# Patient Record
Sex: Female | Born: 1980 | Marital: Married | Smoking: Never smoker
Health system: Southern US, Community
[De-identification: ages and names within clinical notes are randomized; demographics above are authoritative.]

## PROBLEM LIST (undated history)

## (undated) ENCOUNTER — Inpatient Hospital Stay (HOSPITAL_COMMUNITY): Payer: Self-pay

## (undated) DIAGNOSIS — Z8619 Personal history of other infectious and parasitic diseases: Secondary | ICD-10-CM

## (undated) DIAGNOSIS — F419 Anxiety disorder, unspecified: Secondary | ICD-10-CM

## (undated) DIAGNOSIS — O24419 Gestational diabetes mellitus in pregnancy, unspecified control: Secondary | ICD-10-CM

## (undated) DIAGNOSIS — I10 Essential (primary) hypertension: Secondary | ICD-10-CM

## (undated) DIAGNOSIS — G43909 Migraine, unspecified, not intractable, without status migrainosus: Secondary | ICD-10-CM

## (undated) DIAGNOSIS — K219 Gastro-esophageal reflux disease without esophagitis: Secondary | ICD-10-CM

## (undated) DIAGNOSIS — L309 Dermatitis, unspecified: Secondary | ICD-10-CM

## (undated) DIAGNOSIS — J302 Other seasonal allergic rhinitis: Secondary | ICD-10-CM

## (undated) DIAGNOSIS — G988 Other disorders of nervous system: Secondary | ICD-10-CM

## (undated) HISTORY — PX: KNEE SURGERY: SHX244

## (undated) HISTORY — DX: Gastro-esophageal reflux disease without esophagitis: K21.9

## (undated) HISTORY — PX: WISDOM TOOTH EXTRACTION: SHX21

## (undated) HISTORY — DX: Essential (primary) hypertension: I10

## (undated) HISTORY — DX: Gestational diabetes mellitus in pregnancy, unspecified control: O24.419

## (undated) HISTORY — DX: Personal history of other infectious and parasitic diseases: Z86.19

## (undated) HISTORY — PX: BREAST REDUCTION SURGERY: SHX8

## (undated) HISTORY — DX: Other seasonal allergic rhinitis: J30.2

## (undated) HISTORY — DX: Migraine, unspecified, not intractable, without status migrainosus: G43.909

## (undated) HISTORY — DX: Anxiety disorder, unspecified: F41.9

## (undated) HISTORY — DX: Other disorders of nervous system: G98.8

## (undated) HISTORY — PX: BREAST SURGERY: SHX581

---
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
## 2004-06-10 ENCOUNTER — Encounter: Admission: RE | Admit: 2004-06-10 | Discharge: 2004-06-10 | Payer: Self-pay | Admitting: Orthopedic Surgery

## 2006-01-04 ENCOUNTER — Other Ambulatory Visit: Admission: RE | Admit: 2006-01-04 | Discharge: 2006-01-04 | Payer: Self-pay | Admitting: Obstetrics and Gynecology

## 2010-01-20 ENCOUNTER — Other Ambulatory Visit: Admission: RE | Admit: 2010-01-20 | Discharge: 2010-01-20 | Payer: Self-pay | Admitting: Family Medicine

## 2010-09-30 ENCOUNTER — Encounter: Admission: RE | Admit: 2010-09-30 | Discharge: 2010-09-30 | Payer: Self-pay | Admitting: Neurology

## 2011-03-10 ENCOUNTER — Other Ambulatory Visit (HOSPITAL_COMMUNITY)
Admission: RE | Admit: 2011-03-10 | Discharge: 2011-03-10 | Disposition: A | Discharge status: Home / Self Care | Payer: BC Managed Care – PPO | Source: Ambulatory Visit | Attending: Family Medicine | Admitting: Family Medicine

## 2011-03-10 ENCOUNTER — Other Ambulatory Visit: Payer: Self-pay | Admitting: Family Medicine

## 2011-03-10 DIAGNOSIS — Z113 Encounter for screening for infections with a predominantly sexual mode of transmission: Secondary | ICD-10-CM | POA: Insufficient documentation

## 2011-03-10 DIAGNOSIS — Z124 Encounter for screening for malignant neoplasm of cervix: Secondary | ICD-10-CM | POA: Insufficient documentation

## 2012-03-18 ENCOUNTER — Other Ambulatory Visit (HOSPITAL_COMMUNITY)
Admission: RE | Admit: 2012-03-18 | Discharge: 2012-03-18 | Disposition: A | Discharge status: Home / Self Care | Payer: BC Managed Care – PPO | Source: Ambulatory Visit | Attending: Family Medicine | Admitting: Family Medicine

## 2012-03-18 ENCOUNTER — Other Ambulatory Visit: Payer: Self-pay | Admitting: Family Medicine

## 2012-03-18 DIAGNOSIS — Z124 Encounter for screening for malignant neoplasm of cervix: Secondary | ICD-10-CM | POA: Insufficient documentation

## 2013-04-10 ENCOUNTER — Other Ambulatory Visit (HOSPITAL_COMMUNITY)
Admission: RE | Admit: 2013-04-10 | Discharge: 2013-04-10 | Disposition: A | Discharge status: Home / Self Care | Payer: BC Managed Care – PPO | Source: Ambulatory Visit | Attending: Family Medicine | Admitting: Family Medicine

## 2013-04-10 ENCOUNTER — Other Ambulatory Visit: Payer: Self-pay | Admitting: Family Medicine

## 2013-04-10 DIAGNOSIS — Z01419 Encounter for gynecological examination (general) (routine) without abnormal findings: Secondary | ICD-10-CM | POA: Insufficient documentation

## 2013-10-20 ENCOUNTER — Emergency Department (HOSPITAL_COMMUNITY)
Admission: EM | Admit: 2013-10-20 | Discharge: 2013-10-21 | Disposition: A | Discharge status: Home / Self Care | Payer: BC Managed Care – PPO | Attending: Emergency Medicine | Admitting: Emergency Medicine

## 2013-10-20 ENCOUNTER — Encounter (HOSPITAL_COMMUNITY): Payer: Self-pay | Admitting: Emergency Medicine

## 2013-10-20 ENCOUNTER — Emergency Department (HOSPITAL_COMMUNITY): Payer: BC Managed Care – PPO

## 2013-10-20 ENCOUNTER — Other Ambulatory Visit: Payer: Self-pay

## 2013-10-20 DIAGNOSIS — Z79899 Other long term (current) drug therapy: Secondary | ICD-10-CM | POA: Insufficient documentation

## 2013-10-20 DIAGNOSIS — Z3202 Encounter for pregnancy test, result negative: Secondary | ICD-10-CM | POA: Insufficient documentation

## 2013-10-20 DIAGNOSIS — G43709 Chronic migraine without aura, not intractable, without status migrainosus: Secondary | ICD-10-CM | POA: Insufficient documentation

## 2013-10-20 DIAGNOSIS — F411 Generalized anxiety disorder: Secondary | ICD-10-CM | POA: Insufficient documentation

## 2013-10-20 DIAGNOSIS — Z872 Personal history of diseases of the skin and subcutaneous tissue: Secondary | ICD-10-CM | POA: Insufficient documentation

## 2013-10-20 HISTORY — DX: Dermatitis, unspecified: L30.9

## 2013-10-20 HISTORY — DX: Migraine, unspecified, not intractable, without status migrainosus: G43.909

## 2013-10-20 HISTORY — DX: Anxiety disorder, unspecified: F41.9

## 2013-10-20 LAB — CBC WITH DIFFERENTIAL/PLATELET
Basophils Absolute: 0 10*3/uL (ref 0.0–0.1)
HCT: 41.3 % (ref 36.0–46.0)
Lymphocytes Relative: 25 % (ref 12–46)
Lymphs Abs: 1.8 10*3/uL (ref 0.7–4.0)
Monocytes Absolute: 0.4 10*3/uL (ref 0.1–1.0)
Neutro Abs: 4.8 10*3/uL (ref 1.7–7.7)
Platelets: 287 10*3/uL (ref 150–400)
RBC: 4.64 MIL/uL (ref 3.87–5.11)
RDW: 13.3 % (ref 11.5–15.5)
WBC: 7.2 10*3/uL (ref 4.0–10.5)

## 2013-10-20 LAB — POCT I-STAT, CHEM 8
BUN: 6 mg/dL (ref 6–23)
Calcium, Ion: 1.24 mmol/L — ABNORMAL HIGH (ref 1.12–1.23)
Creatinine, Ser: 0.8 mg/dL (ref 0.50–1.10)
Glucose, Bld: 84 mg/dL (ref 70–99)
TCO2: 26 mmol/L (ref 0–100)

## 2013-10-20 LAB — PREGNANCY, URINE: Preg Test, Ur: NEGATIVE

## 2013-10-20 MED ORDER — SODIUM CHLORIDE 0.9 % IV BOLUS (SEPSIS)
1000.0000 mL | Freq: Once | INTRAVENOUS | Status: AC
  Administered 2013-10-20: 1000 mL via INTRAVENOUS

## 2013-10-20 MED ORDER — METOCLOPRAMIDE HCL 5 MG/ML IJ SOLN
10.0000 mg | Freq: Once | INTRAMUSCULAR | Status: AC
  Administered 2013-10-20: 10 mg via INTRAVENOUS
  Filled 2013-10-20: qty 2

## 2013-10-20 MED ORDER — DIPHENHYDRAMINE HCL 50 MG/ML IJ SOLN
25.0000 mg | Freq: Once | INTRAMUSCULAR | Status: AC
  Administered 2013-10-20: 25 mg via INTRAVENOUS
  Filled 2013-10-20: qty 1

## 2013-10-20 MED ORDER — KETOROLAC TROMETHAMINE 30 MG/ML IJ SOLN
30.0000 mg | Freq: Once | INTRAMUSCULAR | Status: AC
  Administered 2013-10-20: 30 mg via INTRAVENOUS
  Filled 2013-10-20: qty 1

## 2013-10-20 NOTE — ED Provider Notes (Signed)
CSN: [id number]     Arrival date & time 10/20/13  1846 History   First MD Initiated Contact with Patient 10/20/13 2251     Chief Complaint  Patient presents with  . Headache   (Consider location/radiation/quality/duration/timing/severity/associated sxs/prior Treatment) HPI Comments: Pt is a 32 y/o female with hx of chronic migraines. She presents with a complaint of a headache. He describes the headache as a throbbing pain. This headache was acute in onset last night during sexual intercourse, it has been relatively persistent since that time and has been severe, not associated with nausea or vomiting or photophobia and has not improved with taking her home medications for the migraines. She is seen by a headache specialist in town, he recommended she come to the emergency department to rule out subarachnoid hemorrhage and aneurysm. The patient does have associated shortness of breath and chest pain which is intermittent as well throughout the day. She has no risk factors for heart disease, nothing seems to make this better or worse.  Patient is a 32 y.o. female presenting with headaches. The history is provided by the patient and a relative.  Headache   Past Medical History  Diagnosis Date  . Migraine   . Anxiety   . Eczema    Past Surgical History  Procedure Laterality Date  . Breast reduction surgery    . Knee surgery     No family history on file. History  Substance Use Topics  . Smoking status: Never Smoker   . Smokeless tobacco: Not on file  . Alcohol Use: Yes   OB History   Grav Para Term Preterm Abortions TAB SAB Ect Mult Living                 Review of Systems  Neurological: Positive for headaches.  All other systems reviewed and are negative.    Allergies  Imitrex and Penicillins  Home Medications   Current Outpatient Rx  Name  Route  Sig  Dispense  Refill  . baclofen (LIORESAL) 10 MG tablet   Oral   Take 10 mg by mouth 3 (three) times daily.          . citalopram (CELEXA) 20 MG tablet   Oral   Take 20 mg by mouth daily.         Marland Kitchen ibuprofen (ADVIL,MOTRIN) 200 MG tablet   Oral   Take 800 mg by mouth every 6 (six) hours as needed for fever.         . naratriptan (AMERGE) 2.5 MG tablet   Oral   Take 2.5 mg by mouth as needed for migraine. Take one (1) tablet at onset of headache; if returns or does not resolve, may repeat after 4 hours; do not exceed five (5) mg in 24 hours.         Marland Kitchen omeprazole (PRILOSEC) 20 MG capsule   Oral   Take 20 mg by mouth daily.         Marland Kitchen zonisamide (ZONEGRAN) 100 MG capsule   Oral   Take 400 mg by mouth at bedtime.         . metoCLOPramide (REGLAN) 10 MG tablet   Oral   Take 1 tablet (10 mg total) by mouth 3 (three) times daily as needed for nausea (headache / nausea).   20 tablet   0   . naproxen (NAPROSYN) 500 MG tablet   Oral   Take 1 tablet (500 mg total) by mouth 2 (two) times daily with  a meal.   30 tablet   0    BP 118/91  Pulse 73  Temp(Src) 99.4 F (37.4 C) (Oral)  Resp 20  Wt 197 lb 12.8 oz (89.721 kg)  SpO2 98%  LMP 09/30/2013 Physical Exam  Nursing note and vitals reviewed. Constitutional: She appears well-developed and well-nourished. No distress.  HENT:  Head: Normocephalic and atraumatic.  Mouth/Throat: Oropharynx is clear and moist. No oropharyngeal exudate.  Eyes: Conjunctivae and EOM are normal. Pupils are equal, round, and reactive to light. Right eye exhibits no discharge. Left eye exhibits no discharge. No scleral icterus.  Neck: Normal range of motion. Neck supple. No JVD present. No thyromegaly present.  Cardiovascular: Normal rate, regular rhythm, normal heart sounds and intact distal pulses.  Exam reveals no gallop and no friction rub.   No murmur heard. Pulmonary/Chest: Effort normal and breath sounds normal. No respiratory distress. She has no wheezes. She has no rales.  Abdominal: Soft. Bowel sounds are normal. She exhibits no distension and no  mass. There is no tenderness.  Musculoskeletal: Normal range of motion. She exhibits no edema and no tenderness.  Lymphadenopathy:    She has no cervical adenopathy.  Neurological: She is alert. Coordination normal.  Speech is clear, cranial nerves III through XII are intact, memory is intact, strength is normal in all 4 extremities including grips, sensation is intact to light touch and pinprick in all 4 extremities. Coordination as tested by finger-nose-finger is normal, no limb ataxia. Normal gait, normal reflexes at the patellar tendons bilaterally  Skin: Skin is warm and dry. No rash noted. No erythema.  Psychiatric: She has a normal mood and affect. Her behavior is normal.    ED Course  Procedures (including critical care time) Labs Review Labs Reviewed  URINALYSIS, ROUTINE W REFLEX MICROSCOPIC - Abnormal; Notable for the following:    Specific Gravity, Urine 1.004 (*)    Hgb urine dipstick SMALL (*)    All other components within normal limits  POCT I-STAT, CHEM 8 - Abnormal; Notable for the following:    Calcium, Ion 1.24 (*)    All other components within normal limits  CBC WITH DIFFERENTIAL  PREGNANCY, URINE  URINE MICROSCOPIC-ADD ON   Imaging Review Ct Angio Head W/cm &/or Wo Cm  10/21/2013   CLINICAL DATA:  Headache with Valsalva, shortness of breath, chest tightness.  EXAM: CT ANGIOGRAPHY HEAD  TECHNIQUE: Multidetector CT imaging of the head was performed using the standard protocol during bolus administration of intravenous contrast. Multiplanar CT image reconstructions including MIPs were obtained to evaluate the vascular anatomy.  CONTRAST:  50mL OMNIPAQUE IOHEXOL 350 MG/ML SOLN  COMPARISON:  Noncontrast CT of the head October 20, 2013 at 2200 hr  FINDINGS: Included cervical, petrous, cavernous and supra clinoid internal carotid arteries are normal in course and caliber, unremarkable. Normal appearance of the anterior and middle cerebral arteries, including more distal  segments.  Left vertebral artery is dominant, with normal appearance of the vertebral arteries, basilar artery. Tiny left posterior communicating artery present with normal appearance of posterior cerebral arteries.  No large vessel occlusion, aneurysm, hemodynamically significant stenosis or dissection. No abnormal luminal irregularity.  Review of the MIP images confirms the above findings.  IMPRESSION: No aneurysm or luminal irregularity/vasculopathy to explain Headache; normal CT angiogram of the head.   Electronically Signed   By: Awilda Metro   On: 10/21/2013 01:54   Ct Head Wo Contrast  10/20/2013   CLINICAL DATA:  Headaches  EXAM: CT  HEAD WITHOUT CONTRAST  TECHNIQUE: Contiguous axial images were obtained from the base of the skull through the vertex without intravenous contrast.  COMPARISON:  None.  FINDINGS: The cerebral and cerebellar hemispheres have a normal attenuation and morphology. No midline shift, ventriculomegaly, mass effect, evidence of mass lesion, intracranial hemorrhage or evidence of cortically based acute infarction. Gray-white matter differentiation is within normal limits throughout the brain. The paranasal sinuses mastoid air cells are clear. The skull is intact.  IMPRESSION: 1. No acute intracranial abnormalities.  Normal brain.   Electronically Signed   By: Signa Kell M.D.   On: 10/20/2013 22:12    EKG Interpretation   None     ED ECG REPORT  I personally interpreted this EKG   Date: 10/21/2013   Rate: 82  Rhythm: normal sinus rhythm  QRS Axis: normal  Intervals: normal  ST/T Wave abnormalities: normal  Conduction Disutrbances:none  Narrative Interpretation:   Old EKG Reviewed: none available   MDM   1. Headache    The patient has an essentially normal exam, she is mildly hypertensive at 148/98 but does not have a fever, does not have a stiff neck and has clear heart and lung sounds. The CT noncontrast head that was performed on her arrival shows no  signs of hemorrhage however this is approximately 24 hours out from the onset of her symptoms and does not quite as sensitive. We discussed the benefits of lumbar puncture versus CT angiogram and the patient has elected to go with a CT angiogram at this time. Given the fact that the patient has a normal neurologic exam and has not been given any medications to help with her headache I will treat her pain, order the angiogram and reevaluate. She appears hemodynamically and neurologically stable at this time. EKG to further evaluate her chest pain.  CT angio neg for aneurysm, pt pain completely resolved after meds - pt states she feels good to go home, return precautions given,  Meds given in ED:  Medications  sodium chloride 0.9 % bolus 1,000 mL (0 mLs Intravenous Stopped 10/20/13 2310)  metoCLOPramide (REGLAN) injection 10 mg (10 mg Intravenous Given 10/20/13 2316)  diphenhydrAMINE (BENADRYL) injection 25 mg (25 mg Intravenous Given 10/20/13 2323)  ketorolac (TORADOL) 30 MG/ML injection 30 mg (30 mg Intravenous Given 10/20/13 2326)  sodium chloride 0.9 % bolus 1,000 mL (1,000 mLs Intravenous New Bag/Given 10/20/13 2333)  iohexol (OMNIPAQUE) 350 MG/ML injection 50 mL (50 mLs Intravenous Contrast Given 10/21/13 0122)    New Prescriptions   METOCLOPRAMIDE (REGLAN) 10 MG TABLET    Take 1 tablet (10 mg total) by mouth 3 (three) times daily as needed for nausea (headache / nausea).   NAPROXEN (NAPROSYN) 500 MG TABLET    Take 1 tablet (500 mg total) by mouth 2 (two) times daily with a meal.      Vida Roller, MD 10/21/13 [phone]

## 2013-10-20 NOTE — ED Notes (Signed)
Pt. reports headache " throbbing " while having sex last night with mild SOB and chest tightness , speech clear , no facial asymmetry , equal grips with no arm drift / ambulatory.

## 2013-10-20 NOTE — ED Notes (Signed)
PT reports when taking Amerge and Ibuprofen for migraines, that her hands and feet itch and break out into hives. PT states that her hands were swollen when taking this combination of medications a few weeks ago. PT states "I think taking the two medications together may be causing this reaction. When I take Amerge by itself, the reaction doesn't occur."

## 2013-10-20 NOTE — ED Notes (Signed)
Patient transported to CT 

## 2013-10-21 ENCOUNTER — Emergency Department (HOSPITAL_COMMUNITY): Payer: BC Managed Care – PPO

## 2013-10-21 ENCOUNTER — Encounter (HOSPITAL_COMMUNITY): Payer: Self-pay | Admitting: Radiology

## 2013-10-21 LAB — URINALYSIS, ROUTINE W REFLEX MICROSCOPIC
Bilirubin Urine: NEGATIVE
Ketones, ur: NEGATIVE mg/dL
Nitrite: NEGATIVE
Protein, ur: NEGATIVE mg/dL
Urobilinogen, UA: 0.2 mg/dL (ref 0.0–1.0)

## 2013-10-21 MED ORDER — IOHEXOL 350 MG/ML SOLN
50.0000 mL | Freq: Once | INTRAVENOUS | Status: AC | PRN
  Administered 2013-10-21: 50 mL via INTRAVENOUS

## 2013-10-21 MED ORDER — METOCLOPRAMIDE HCL 10 MG PO TABS: 10.0000 mg | ORAL_TABLET | Freq: Three times a day (TID) | ORAL | Status: DC | PRN

## 2013-10-21 MED ORDER — NAPROXEN 500 MG PO TABS: 500.0000 mg | ORAL_TABLET | Freq: Two times a day (BID) | ORAL | Status: DC

## 2014-04-29 ENCOUNTER — Ambulatory Visit (INDEPENDENT_AMBULATORY_CARE_PROVIDER_SITE_OTHER): Payer: BC Managed Care – PPO | Admitting: Family Medicine

## 2014-04-29 ENCOUNTER — Encounter (INDEPENDENT_AMBULATORY_CARE_PROVIDER_SITE_OTHER): Payer: Self-pay

## 2014-04-29 VITALS — BP 113/80 | HR 61 | Temp 98.2°F | Resp 16 | Ht 62.75 in | Wt 212.0 lb

## 2014-04-29 DIAGNOSIS — L03213 Periorbital cellulitis: Secondary | ICD-10-CM

## 2014-04-29 DIAGNOSIS — L03211 Cellulitis of face: Secondary | ICD-10-CM

## 2014-04-29 MED ORDER — BACITRACIN-POLYMYXIN B 500-10000 UNIT/GM OP OINT
0.5000 [in_us] | TOPICAL_OINTMENT | Freq: Two times a day (BID) | OPHTHALMIC | Status: AC
Start: 2014-04-29 — End: 2014-05-09

## 2014-04-29 MED ORDER — CEFDINIR 300 MG PO CAPS
300.0000 mg | ORAL_CAPSULE | Freq: Two times a day (BID) | ORAL | Status: AC
Start: 2014-04-29 — End: 2014-05-04

## 2014-04-29 NOTE — Progress Notes (Signed)
Subjective:       Patient ID: Becky Long is a 33 y.o. female.    Eye Problem   The left eye is affected. This is a new problem. The current episode started yesterday. The problem occurs constantly. The problem has been gradually worsening. There was no injury mechanism. The pain is at a severity of 1/10. The pain is mild. There is no known exposure to pink eye. She does not wear contacts. Associated symptoms include vomiting. Pertinent negatives include no blurred vision, eye discharge, double vision, eye redness, fever, foreign body sensation, itching, nausea, photophobia or recent URI. She has tried nothing for the symptoms. The treatment provided no relief.   swelling  And redness left peri-orbital area  Patient was already on cefdinir for sinusitis    The following portions of the patient's history were reviewed and updated as appropriate: allergies, current medications, past family history, past medical history, past social history, past surgical history and problem list.    Review of Systems   Constitutional: Negative for fever.   Eyes: Negative for blurred vision, double vision, photophobia, discharge and redness.   Gastrointestinal: Positive for vomiting. Negative for nausea.   Skin: Negative for itching.           Objective:     Physical Exam   Vitals reviewed.  Constitutional: She appears well-developed.   Eyes:       Cardiovascular: Normal rate and regular rhythm.    No murmur heard.  Pulmonary/Chest: Breath sounds normal. No respiratory distress.   Neurological: She is alert. No cranial nerve deficit. Coordination normal.   Skin: Skin is warm and dry.     BP 113/80 mmHg  Pulse 61  Temp(Src) 98.2 F (36.8 C) (Tympanic)  Resp 16  Ht 1.594 m (5' 2.75")  Wt 96.163 kg (212 lb)  BMI 37.85 kg/m2  LMP 04/09/2014        Assessment:       1. Periorbital cellulitis of left eye  cefdinir (OMNICEF) 300 MG capsule    bacitracin-polymyxin b (POLYSPORIN) ophthalmic ointment            Plan:       1)  peri-orbital cellulitis left eye  5 more days of Cefdinir given to finish the course  Topical antibiotics for eye  Advice given, see AVS  Advice to come  Back or ER if the symptoms worsens or persists

## 2014-04-29 NOTE — Progress Notes (Signed)
Patient also taking nexplanon and ronegran

## 2014-04-29 NOTE — Patient Instructions (Signed)
Periorbital Cellulitis   This is an infection of the tissues around the eye. It is most often due to an infected scratch or insect bite. Sometimes a sinus infection can cause this problem.  Home Care:  1) Take your antibiotic medicine exactly as directed, until it is finished.  2) You may use acetaminophen (Tylenol) or ibuprofen (Motrin, Advil) to control pain, unless another pain medicine was prescribed. [NOTE: If you have liver disease or ever had a stomach ulcer, talk with your doctor before using these medicines.] Do not use ibuprofen in children under six months of age.  Follow Up  with your doctor or as advised by our staff.  Return Promptly  or contact your doctor if any of the following occur:  -- Increasing swelling around the eye  -- Increasing redness  -- Fever of 100.5 (38 C) oral or 101.5 (38.6 C) rectal for more than two days on antibiotics   2000-2014 [employer], [address], [place] [address]. All rights reserved. This information is not intended as a substitute for professional medical care. Always follow your healthcare professional's instructions.

## 2015-07-26 LAB — OB RESULTS CONSOLE GC/CHLAMYDIA
Chlamydia: NEGATIVE
GC PROBE AMP, GENITAL: NEGATIVE

## 2015-10-27 NOTE — L&D Delivery Note (Signed)
Delivery Note At 2:21 AM a healthy female was delivered via Vaginal, Spontaneous Delivery (Presentation: ; Occiput Anterior).  APGAR: 9, 9; weight pending.   Placenta status: Intact, Spontaneous.  Cord: 3 vessels with the following complications: None.  Cord pH: N/A  Anesthesia: Epidural  Episiotomy: None Lacerations:  1st degree Suture Repair: 3.0 vicryl rapide Est. Blood Loss (mL): 400  Mom to postpartum.  Baby to Couplet care / Skin to Skin. Continue Labetalol at 100 BID.  FBS/PP x 24 hrs off Glyburide.  Amylee Lodato,MARIE-LYNE 02/06/2016, 3:04 AM

## 2015-12-11 ENCOUNTER — Encounter: Payer: Managed Care, Other (non HMO) | Attending: Obstetrics & Gynecology

## 2015-12-11 VITALS — Ht 63.0 in | Wt 212.1 lb

## 2015-12-11 DIAGNOSIS — O99810 Abnormal glucose complicating pregnancy: Secondary | ICD-10-CM | POA: Insufficient documentation

## 2015-12-11 DIAGNOSIS — O24419 Gestational diabetes mellitus in pregnancy, unspecified control: Secondary | ICD-10-CM

## 2015-12-12 NOTE — Progress Notes (Signed)
  Patient was seen on 12/11/15 for Gestational Diabetes self-management . The following learning objectives were met by the patient :   States the definition of Gestational Diabetes  States why dietary management is important in controlling blood glucose  Describes the effects of carbohydrates on blood glucose levels  Demonstrates ability to create a balanced meal plan  Demonstrates carbohydrate counting   States when to check blood glucose levels  Demonstrates proper blood glucose monitoring techniques  States the effect of stress and exercise on blood glucose levels  States the importance of limiting caffeine and abstaining from alcohol and smoking  Plan:  Aim for 2 Carb Choices per meal (30 grams) +/- 1 either way for breakfast Aim for 3 Carb Choices per meal (45 grams) +/- 1 either way from lunch and dinner Aim for 1-2 Carbs per snack Begin reading food labels for Total Carbohydrate and sugar grams of foods Consider  increasing your activity level by walking daily as tolerated Begin checking BG before breakfast and 1-2 hours after first bit of breakfast, lunch and dinner after  as directed by MD  Take medication  as directed by MD  Blood glucose monitor given: One Touch Verio Flex Lot # [id number] X Exp: 12/2016 Blood glucose reading: 103  Patient instructed to monitor glucose levels: FBS: 60 - <90 1 hour: <140 2 hour: <120  Patient received the following handouts:  Nutrition Diabetes and Pregnancy  Carbohydrate Counting List  Meal Planning worksheet  Patient will be seen for follow-up as needed.

## 2015-12-18 ENCOUNTER — Ambulatory Visit: Payer: Self-pay

## 2016-01-16 LAB — OB RESULTS CONSOLE HEPATITIS B SURFACE ANTIGEN: HEP B S AG: NEGATIVE

## 2016-01-16 LAB — OB RESULTS CONSOLE ANTIBODY SCREEN: Antibody Screen: NEGATIVE

## 2016-01-16 LAB — OB RESULTS CONSOLE ABO/RH: RH TYPE: POSITIVE

## 2016-01-16 LAB — OB RESULTS CONSOLE GBS: STREP GROUP B AG: NEGATIVE

## 2016-01-16 LAB — OB RESULTS CONSOLE RUBELLA ANTIBODY, IGM: RUBELLA: IMMUNE

## 2016-01-16 LAB — OB RESULTS CONSOLE HIV ANTIBODY (ROUTINE TESTING): HIV: NONREACTIVE

## 2016-01-16 LAB — OB RESULTS CONSOLE RPR: RPR: NONREACTIVE

## 2016-01-26 ENCOUNTER — Inpatient Hospital Stay (HOSPITAL_COMMUNITY)
Admission: AD | Admit: 2016-01-26 | Discharge: 2016-01-26 | Disposition: A | Discharge status: Home / Self Care | Payer: Managed Care, Other (non HMO) | Source: Ambulatory Visit | Attending: Obstetrics and Gynecology | Admitting: Obstetrics and Gynecology

## 2016-01-26 ENCOUNTER — Encounter (HOSPITAL_COMMUNITY): Payer: Self-pay | Admitting: *Deleted

## 2016-01-26 DIAGNOSIS — O10013 Pre-existing essential hypertension complicating pregnancy, third trimester: Secondary | ICD-10-CM | POA: Insufficient documentation

## 2016-01-26 DIAGNOSIS — O24419 Gestational diabetes mellitus in pregnancy, unspecified control: Secondary | ICD-10-CM | POA: Diagnosis not present

## 2016-01-26 DIAGNOSIS — Z3A36 36 weeks gestation of pregnancy: Secondary | ICD-10-CM | POA: Diagnosis not present

## 2016-01-26 DIAGNOSIS — Z880 Allergy status to penicillin: Secondary | ICD-10-CM | POA: Insufficient documentation

## 2016-01-26 LAB — CBC
HEMATOCRIT: 30.7 % — AB (ref 36.0–46.0)
HEMOGLOBIN: 10.7 g/dL — AB (ref 12.0–15.0)
MCH: 29.6 pg (ref 26.0–34.0)
MCHC: 34.9 g/dL (ref 30.0–36.0)
MCV: 84.8 fL (ref 78.0–100.0)
Platelets: 260 10*3/uL (ref 150–400)
RBC: 3.62 MIL/uL — ABNORMAL LOW (ref 3.87–5.11)
RDW: 13.8 % (ref 11.5–15.5)
WBC: 9.3 10*3/uL (ref 4.0–10.5)

## 2016-01-26 LAB — COMPREHENSIVE METABOLIC PANEL
ALK PHOS: 120 U/L (ref 38–126)
ALT: 14 U/L (ref 14–54)
AST: 17 U/L (ref 15–41)
Albumin: 3 g/dL — ABNORMAL LOW (ref 3.5–5.0)
Anion gap: 6 (ref 5–15)
BUN: 9 mg/dL (ref 6–20)
CO2: 21 mmol/L — ABNORMAL LOW (ref 22–32)
Calcium: 8.6 mg/dL — ABNORMAL LOW (ref 8.9–10.3)
Chloride: 106 mmol/L (ref 101–111)
Creatinine, Ser: 0.74 mg/dL (ref 0.44–1.00)
GFR calc Af Amer: >60 mL/min (ref >60)
GFR calc non Af Amer: >60 mL/min (ref >60)
GLUCOSE: 107 mg/dL — AB (ref 65–99)
Potassium: 3.9 mmol/L (ref 3.5–5.1)
Sodium: 133 mmol/L — ABNORMAL LOW (ref 135–145)
TOTAL PROTEIN: 6.1 g/dL — AB (ref 6.5–8.1)
Total Bilirubin: 0.3 mg/dL (ref 0.3–1.2)

## 2016-01-26 LAB — PROTEIN / CREATININE RATIO, URINE
Creatinine, Urine: 32 mg/dL
PROTEIN CREATININE RATIO: 0.25 mg/mg{creat} — AB (ref 0.00–0.15)
Total Protein, Urine: 8 mg/dL

## 2016-01-26 LAB — URIC ACID: Uric Acid, Serum: 4.1 mg/dL (ref 2.3–6.6)

## 2016-01-26 NOTE — MAU Provider Note (Signed)
History     No chief complaint on file. cc: BP 165/'107 34 yo G1P0 MWF @ 36 4/[redacted] weeks  Gestation with known chronic HTN on labetalol presents for evaluation due to complaint of blurry vision and elevated BP. Denies leg swelling, RUQ pain. Slight h/a earlier (+) FM. Pt has been on labetalol 100 mg po qd  OB History    Gravida Para Term Preterm AB TAB SAB Ectopic Multiple Living   1               Past Medical History  Diagnosis Date  . Migraine   . Anxiety   . Eczema   . Gestational diabetes mellitus (GDM), antepartum   . Hypertension     Past Surgical History  Procedure Laterality Date  . Breast reduction surgery    . Knee surgery    . Wisdom tooth extraction      No family history on file.  Social History  Substance Use Topics  . Smoking status: Never Smoker   . Smokeless tobacco: Not on file  . Alcohol Use: Yes    Allergies:  Allergies  Allergen Reactions  . Imitrex [Sumatriptan] Other (See Comments)    Jaw issues   . Penicillins Rash    Prescriptions prior to admission  Medication Sig Dispense Refill Last Dose  . baclofen (LIORESAL) 10 MG tablet Take 10 mg by mouth 3 (three) times daily. Reported on 12/12/2015   Not Taking  . citalopram (CELEXA) 20 MG tablet Take 20 mg by mouth daily. Reported on 12/12/2015   Not Taking  . ibuprofen (ADVIL,MOTRIN) 200 MG tablet Take 800 mg by mouth every 6 (six) hours as needed for fever. Reported on 12/12/2015   Not Taking  . labetalol (NORMODYNE) 100 MG tablet Take 100 mg by mouth once.     . metoCLOPramide (REGLAN) 10 MG tablet Take 1 tablet (10 mg total) by mouth 3 (three) times daily as needed for nausea (headache / nausea). (Patient not taking: Reported on 12/12/2015) 20 tablet 0 Not Taking  . naproxen (NAPROSYN) 500 MG tablet Take 1 tablet (500 mg total) by mouth 2 (two) times daily with a meal. (Patient not taking: Reported on 12/12/2015) 30 tablet 0 Not Taking  . naratriptan (AMERGE) 2.5 MG tablet Take 2.5 mg by mouth as  needed for migraine. Reported on 12/12/2015   Not Taking  . omeprazole (PRILOSEC) 20 MG capsule Take 20 mg by mouth daily. Reported on 12/12/2015   Not Taking  . Prenat w/o A-[id number]-[id number] (CITRANATAL RX PO) Take 1 each by mouth daily.     . ranitidine (ZANTAC) 150 MG capsule Take 150 mg by mouth 2 (two) times daily.     Marland Kitchen zonisamide (ZONEGRAN) 100 MG capsule Take 400 mg by mouth at bedtime. Reported on 12/12/2015   Not Taking     Physical Exam   Blood pressure 156/100, pulse 116, temperature 98.9 F (37.2 C), temperature source Oral, resp. rate 18, height 5' 2.75" (1.594 m), weight 97.07 kg (214 lb), SpO2 99 %.  General appearance: alert, cooperative and no distress Lungs: clear to auscultation bilaterally Heart: regular rate and rhythm, S1, S2 normal, no murmur, click, rub or gallop Abdomen: gravid nontender Pelvic: deferred Extremities: no edema, redness or tenderness in the calves or thighs and DTR 2 + no clonus Skin: Skin color, texture, turgor normal. No rashes or lesions   Tracing: baseline 135 (+) accel 150-160  IMP: chronic HTN r/o superimposed preeclampsia IUP @ 36 1/2 weeks  P) PIH labs. Creatinine/protein ratio  ED Course   MDM   Chelsea Frost A, MD 9:52 PM 01/26/2016   Addendum; CBC Latest Ref Rng 01/26/2016 10/20/2013 10/20/2013  WBC 4.0 - 10.5 K/uL 9.3 - 7.2  Hemoglobin 12.0 - 15.0 g/dL 10.7(L) 14.3 14.5  Hematocrit 36.0 - 46.0 % 30.7(L) 42.0 41.3  Platelets 150 - 400 K/uL 260 - 287   CMP Latest Ref Rng 01/26/2016 10/20/2013  Glucose 65 - 99 mg/dL 098(J107(H) 84  BUN 6 - 20 mg/dL 9 6  Creatinine 1.910.44 - 1.00 mg/dL 4.780.74 2.950.80  Sodium 621135 - 145 mmol/L 133(L) 141  Potassium 3.5 - 5.1 mmol/L 3.9 3.7  Chloride 101 - 111 mmol/L 106 103  CO2 22 - 32 mmol/L 21(L) -  Calcium 8.9 - 10.3 mg/dL 3.0(Q8.6(L) -  Total Protein 6.5 - 8.1 g/dL 6.1(L) -  Total Bilirubin 0.3 - 1.2 mg/dL 0.3 -  Alkaline Phos 38 - 126 U/L 120 -  AST 15 - 41 U/L 17 -  ALT 14 - 54 U/L 14 -    Protein/creatinine: 0.25  No evidence of superimposed preeclampsia P) d/c home. Preeclampsia warning signs. Increase labetalol to 100mg  po bid. Keep scheduled appt

## 2016-01-26 NOTE — MAU Note (Signed)
RN called by Dr. Cherly Hensenousins, having reviewed all the lab work. BPs reported to her.  Orders received to D/C home with instructions to increase current dose of Labetalol to BID and follow up in office as scheduled.

## 2016-01-26 NOTE — MAU Note (Signed)
Pt states she has had a dizzy feeling and some visual blurring today, history of chronic HTN and on meds but b/p at home was 165/107

## 2016-01-31 ENCOUNTER — Telehealth (HOSPITAL_COMMUNITY): Payer: Self-pay | Admitting: *Deleted

## 2016-01-31 ENCOUNTER — Encounter (HOSPITAL_COMMUNITY): Payer: Self-pay | Admitting: *Deleted

## 2016-01-31 ENCOUNTER — Other Ambulatory Visit: Payer: Self-pay | Admitting: Obstetrics & Gynecology

## 2016-01-31 NOTE — Telephone Encounter (Signed)
Preadmission screen  

## 2016-02-05 ENCOUNTER — Inpatient Hospital Stay (HOSPITAL_COMMUNITY): Payer: Managed Care, Other (non HMO) | Admitting: Anesthesiology

## 2016-02-05 ENCOUNTER — Inpatient Hospital Stay (HOSPITAL_COMMUNITY)
Admission: RE | Admit: 2016-02-05 | Discharge: 2016-02-07 | DRG: 774 | Disposition: A | Discharge status: Home / Self Care | Payer: Managed Care, Other (non HMO) | Source: Ambulatory Visit | Attending: Obstetrics & Gynecology | Admitting: Obstetrics & Gynecology

## 2016-02-05 ENCOUNTER — Encounter (HOSPITAL_COMMUNITY): Payer: Self-pay

## 2016-02-05 DIAGNOSIS — O99344 Other mental disorders complicating childbirth: Secondary | ICD-10-CM | POA: Diagnosis present

## 2016-02-05 DIAGNOSIS — Z825 Family history of asthma and other chronic lower respiratory diseases: Secondary | ICD-10-CM

## 2016-02-05 DIAGNOSIS — D509 Iron deficiency anemia, unspecified: Secondary | ICD-10-CM | POA: Diagnosis present

## 2016-02-05 DIAGNOSIS — O24425 Gestational diabetes mellitus in childbirth, controlled by oral hypoglycemic drugs: Secondary | ICD-10-CM | POA: Diagnosis present

## 2016-02-05 DIAGNOSIS — O1002 Pre-existing essential hypertension complicating childbirth: Secondary | ICD-10-CM | POA: Diagnosis present

## 2016-02-05 DIAGNOSIS — Z820 Family history of epilepsy and other diseases of the nervous system: Secondary | ICD-10-CM | POA: Diagnosis not present

## 2016-02-05 DIAGNOSIS — O9902 Anemia complicating childbirth: Secondary | ICD-10-CM | POA: Diagnosis present

## 2016-02-05 DIAGNOSIS — O9962 Diseases of the digestive system complicating childbirth: Secondary | ICD-10-CM | POA: Diagnosis present

## 2016-02-05 DIAGNOSIS — F419 Anxiety disorder, unspecified: Secondary | ICD-10-CM | POA: Diagnosis present

## 2016-02-05 DIAGNOSIS — Z3A38 38 weeks gestation of pregnancy: Secondary | ICD-10-CM

## 2016-02-05 DIAGNOSIS — K219 Gastro-esophageal reflux disease without esophagitis: Secondary | ICD-10-CM | POA: Diagnosis present

## 2016-02-05 LAB — CBC WITH DIFFERENTIAL/PLATELET
BASOS PCT: 0 %
Basophils Absolute: 0 10*3/uL (ref 0.0–0.1)
Eosinophils Absolute: 0.1 10*3/uL (ref 0.0–0.7)
Eosinophils Relative: 1 %
HEMATOCRIT: 31.5 % — AB (ref 36.0–46.0)
HEMOGLOBIN: 10.6 g/dL — AB (ref 12.0–15.0)
LYMPHS PCT: 13 %
Lymphs Abs: 1.2 10*3/uL (ref 0.7–4.0)
MCH: 29 pg (ref 26.0–34.0)
MCHC: 33.7 g/dL (ref 30.0–36.0)
MCV: 86.3 fL (ref 78.0–100.0)
MONOS PCT: 4 %
Monocytes Absolute: 0.3 10*3/uL (ref 0.1–1.0)
NEUTROS ABS: 7.5 10*3/uL (ref 1.7–7.7)
NEUTROS PCT: 82 %
Platelets: 246 10*3/uL (ref 150–400)
RBC: 3.65 MIL/uL — ABNORMAL LOW (ref 3.87–5.11)
RDW: 13.9 % (ref 11.5–15.5)
WBC: 9.1 10*3/uL (ref 4.0–10.5)

## 2016-02-05 LAB — TYPE AND SCREEN
ABO/RH(D): A POS
ANTIBODY SCREEN: NEGATIVE

## 2016-02-05 LAB — GLUCOSE, CAPILLARY
GLUCOSE-CAPILLARY: 77 mg/dL (ref 65–99)
GLUCOSE-CAPILLARY: 119 mg/dL — AB (ref 65–99)
Glucose-Capillary: 81 mg/dL (ref 65–99)
Glucose-Capillary: 85 mg/dL (ref 65–99)

## 2016-02-05 LAB — ABO/RH: ABO/RH(D): A POS

## 2016-02-05 MED ORDER — LACTATED RINGERS IV SOLN
INTRAVENOUS | Status: DC
  Administered 2016-02-05 (×2): via INTRAVENOUS

## 2016-02-05 MED ORDER — OXYTOCIN 10 UNIT/ML IJ SOLN
1.0000 m[IU]/min | INTRAVENOUS | Status: DC
  Administered 2016-02-05: 2 m[IU]/min via INTRAVENOUS
  Filled 2016-02-05: qty 4

## 2016-02-05 MED ORDER — LIDOCAINE HCL (PF) 1 % IJ SOLN
INTRAMUSCULAR | Status: DC | PRN
  Administered 2016-02-05 (×2): 4 mL via EPIDURAL

## 2016-02-05 MED ORDER — PHENYLEPHRINE 40 MCG/ML (10ML) SYRINGE FOR IV PUSH (FOR BLOOD PRESSURE SUPPORT)
80.0000 ug | PREFILLED_SYRINGE | INTRAVENOUS | Status: DC | PRN
  Filled 2016-02-05: qty 20
  Filled 2016-02-05: qty 2

## 2016-02-05 MED ORDER — LACTATED RINGERS IV SOLN: 500.0000 mL | Freq: Once | INTRAVENOUS | Status: DC

## 2016-02-05 MED ORDER — EPHEDRINE 5 MG/ML INJ
10.0000 mg | INTRAVENOUS | Status: DC | PRN
  Filled 2016-02-05: qty 2

## 2016-02-05 MED ORDER — LACTATED RINGERS IV SOLN
500.0000 mL | Freq: Once | INTRAVENOUS | Status: DC
Start: 2016-02-05 — End: 2016-02-06

## 2016-02-05 MED ORDER — ONDANSETRON HCL 4 MG/2ML IJ SOLN: 4.0000 mg | Freq: Four times a day (QID) | INTRAMUSCULAR | Status: DC | PRN

## 2016-02-05 MED ORDER — OXYTOCIN 10 UNIT/ML IJ SOLN
2.5000 [IU]/h | INTRAVENOUS | Status: DC
  Administered 2016-02-06: 2.5 [IU]/h via INTRAVENOUS
  Filled 2016-02-05: qty 4

## 2016-02-05 MED ORDER — LACTATED RINGERS IV SOLN
500.0000 mL | INTRAVENOUS | Status: DC | PRN
Start: 2016-02-05 — End: 2016-02-06
  Administered 2016-02-05: 500 mL via INTRAVENOUS

## 2016-02-05 MED ORDER — LABETALOL HCL 200 MG PO TABS
200.0000 mg | ORAL_TABLET | Freq: Two times a day (BID) | ORAL | Status: DC
  Administered 2016-02-05 (×2): 200 mg via ORAL
  Filled 2016-02-05 (×3): qty 1

## 2016-02-05 MED ORDER — PHENYLEPHRINE 40 MCG/ML (10ML) SYRINGE FOR IV PUSH (FOR BLOOD PRESSURE SUPPORT)
80.0000 ug | PREFILLED_SYRINGE | INTRAVENOUS | Status: DC | PRN
  Filled 2016-02-05: qty 2

## 2016-02-05 MED ORDER — FENTANYL 2.5 MCG/ML BUPIVACAINE 1/10 % EPIDURAL INFUSION (WH - ANES)
14.0000 mL/h | INTRAMUSCULAR | Status: DC | PRN
  Administered 2016-02-05 (×2): 14 mL/h via EPIDURAL
  Filled 2016-02-05 (×2): qty 125

## 2016-02-05 MED ORDER — ACETAMINOPHEN 325 MG PO TABS
650.0000 mg | ORAL_TABLET | ORAL | Status: DC | PRN
  Administered 2016-02-05 – 2016-02-06 (×2): 650 mg via ORAL
  Filled 2016-02-05 (×2): qty 2

## 2016-02-05 MED ORDER — LIDOCAINE HCL (PF) 1 % IJ SOLN
30.0000 mL | INTRAMUSCULAR | Status: DC | PRN
  Administered 2016-02-06: 30 mL via SUBCUTANEOUS
  Filled 2016-02-05: qty 30

## 2016-02-05 MED ORDER — OXYCODONE-ACETAMINOPHEN 5-325 MG PO TABS: 1.0000 | ORAL_TABLET | ORAL | Status: DC | PRN

## 2016-02-05 MED ORDER — DIPHENHYDRAMINE HCL 50 MG/ML IJ SOLN: 12.5000 mg | INTRAMUSCULAR | Status: DC | PRN

## 2016-02-05 MED ORDER — CITRIC ACID-SODIUM CITRATE 334-500 MG/5ML PO SOLN: 30.0000 mL | ORAL | Status: DC | PRN

## 2016-02-05 MED ORDER — TERBUTALINE SULFATE 1 MG/ML IJ SOLN
0.2500 mg | Freq: Once | INTRAMUSCULAR | Status: DC | PRN
  Filled 2016-02-05: qty 1

## 2016-02-05 MED ORDER — OXYTOCIN BOLUS FROM INFUSION: 500.0000 mL | INTRAVENOUS | Status: DC

## 2016-02-05 MED ORDER — OXYCODONE-ACETAMINOPHEN 5-325 MG PO TABS: 2.0000 | ORAL_TABLET | ORAL | Status: DC | PRN

## 2016-02-05 NOTE — H&P (Signed)
Chelsea Frost is a 35 y.o. female G1P0000 [id number] presenting for Induction re GDMA2 Glyburide and cHTN on Labetalol.  HPP/HPI:  cHTN on Labetalol, no evidence of PEC.  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  OB History    Gravida Para Term Preterm AB TAB SAB Ectopic Multiple Living   1 0 0 0 0 0 0 0 0 0      Past Medical History  Diagnosis Date  . Migraine   . Anxiety   . Eczema   . Gestational diabetes mellitus (GDM), antepartum   . GERD (gastroesophageal reflux disease)   . Hx of varicella   . Gestational diabetes     glyburide  . Hypertension     labetolol 200 mg bid   Past Surgical History  Procedure Laterality Date  . Breast reduction surgery    . Knee surgery    . Wisdom tooth extraction     Family History: family history includes COPD in her maternal grandmother; Cancer in her maternal grandfather; Hiatal hernia in her mother; Lupus in her father; Multiple sclerosis in her paternal aunt; Pulmonary embolism in her father. Social History:  reports that she has never smoked. She has never used smokeless tobacco. She reports that she drinks alcohol. She reports that she does not use illicit drugs.  Allergies  Allergen Reactions  . Imitrex [Sumatriptan] Other (See Comments)    Jaw issues   . Amoxicillin Rash    Has patient had a PCN reaction causing immediate rash, facial/tongue/throat swelling, SOB or lightheadedness with hypotension: No Has patient had a PCN reaction causing severe rash involving mucus membranes or skin necrosis: No Has patient had a PCN reaction that required hospitalization No Has patient had a PCN reaction occurring within the last 10 years: No If all of the above answers are "NO", then may proceed with Cephalosporin use.     Dilation: 4 Effacement (%): 90 Station: -2 Exam by:: Dr Seymour BarsLavoie  AROM clear AF ++  Blood pressure 122/80, pulse 82, temperature 97.8 F (36.6 C), temperature source Oral, resp. rate 16, height 5\' 2"  (1.575 m), weight 214  lb (97.07 kg), SpO2 100 %. Exam Physical Exam   FHR Base line 130's with good variability, accelerations present, no deceleration  HPP:  Patient Active Problem List   Diagnosis Date Noted  . Labor abnormal 02/05/2016    Prenatal labs: ABO, Rh: --/--/A POS, A POS (04/12 0750) Antibody: NEG (04/12 0750) Rubella: Immune RPR: Nonreactive (03/23 0000)  HBsAg: Negative (03/23 0000)  HIV: Non-reactive (03/23 0000)  Genetic testing:  Ultrascreen wnl US anato: wnl 1 hr GTT/3 hr GTT Abnormal GBS: Negative (03/23 0000)   Assessment/Plan: 38 wks with GDMA2 well controled on Glyburide and cHTN on Labetalol.  No evidence of PEC.  FHR Cat 1.  Induction Pitocin/AROM.  Epidural PRN.   Payam Gribble,MARIE-LYNE 02/05/2016, 5:33 PM

## 2016-02-05 NOTE — Anesthesia Preprocedure Evaluation (Signed)
Anesthesia Evaluation  Patient identified by MRN, date of birth, ID band Patient awake    Reviewed: Allergy & Precautions, NPO status , Patient's Chart, lab work & pertinent test results, reviewed documented beta blocker date and time   Airway Mallampati: III  TM Distance: >3 FB Neck ROM: Full    Dental  (+) Teeth Intact   Pulmonary    breath sounds clear to auscultation       Cardiovascular hypertension, Pt. on medications and Pt. on home beta blockers  Rhythm:Regular Rate:Normal     Neuro/Psych  Headaches, PSYCHIATRIC DISORDERS Anxiety    GI/Hepatic Neg liver ROS, GERD  Medicated,  Endo/Other  diabetes  Renal/GU negative Renal ROS  negative genitourinary   Musculoskeletal negative musculoskeletal ROS (+)   Abdominal   Peds negative pediatric ROS (+)  Hematology negative hematology ROS (+)   Anesthesia Other Findings   Reproductive/Obstetrics (+) Pregnancy                             Lab Results  Component Value Date   WBC 9.1 02/05/2016   HGB 10.6* 02/05/2016   HCT 31.5* 02/05/2016   MCV 86.3 02/05/2016   PLT 246 02/05/2016   No results found for: INR, PROTIME   Anesthesia Physical Anesthesia Plan  ASA: III  Anesthesia Plan: Epidural   Post-op Pain Management:    Induction:   Airway Management Planned:   Additional Equipment:   Intra-op Plan:   Post-operative Plan:   Informed Consent: I have reviewed the patients History and Physical, chart, labs and discussed the procedure including the risks, benefits and alternatives for the proposed anesthesia with the patient or authorized representative who has indicated his/her understanding and acceptance.     Plan Discussed with:   Anesthesia Plan Comments:         Anesthesia Quick Evaluation

## 2016-02-05 NOTE — Progress Notes (Signed)
Tarri GlennAmy Thacker, RN watching patient from [phone]

## 2016-02-05 NOTE — Anesthesia Procedure Notes (Signed)
Epidural Patient location during procedure: OB Start time: 02/05/2016 4:59 PM End time: 02/05/2016 5:06 PM  Staffing Anesthesiologist: Shona SimpsonHOLLIS, KEVIN D Performed by: anesthesiologist   Preanesthetic Checklist Completed: patient identified, site marked, surgical consent, pre-op evaluation, timeout performed, IV checked, risks and benefits discussed and monitors and equipment checked  Epidural Patient position: sitting Prep: ChloraPrep Patient monitoring: heart rate, continuous pulse ox and blood pressure Approach: midline Location: L3-L4 Injection technique: LOR saline  Needle:  Needle type: Tuohy  Needle gauge: 17 G Needle length: 9 cm Catheter type: closed end flexible Catheter size: 20 Guage Test dose: negative and 1.5% lidocaine  Assessment Events: blood not aspirated, injection not painful, no injection resistance and no paresthesia  Additional Notes LOR @ 6  Patient identified. Risks/Benefits/Options discussed with patient including but not limited to bleeding, infection, nerve damage, paralysis, failed block, incomplete pain control, headache, blood pressure changes, nausea, vomiting, reactions to medications, itching and postpartum back pain. Confirmed with bedside nurse the patient's most recent platelet count. Confirmed with patient that they are not currently taking any anticoagulation, have any bleeding history or any family history of bleeding disorders. Patient expressed understanding and wished to proceed. All questions were answered. Sterile technique was used throughout the entire procedure. Please see nursing notes for vital signs. Test dose was given through epidural catheter and negative prior to continuing to dose epidural or start infusion. Warning signs of high block given to the patient including shortness of breath, tingling/numbness in hands, complete motor block, or any concerning symptoms with instructions to call for help. Patient was given instructions on  fall risk and not to get out of bed. All questions and concerns addressed with instructions to call with any issues or inadequate analgesia.    Reason for block:procedure for pain

## 2016-02-06 ENCOUNTER — Encounter (HOSPITAL_COMMUNITY): Payer: Self-pay

## 2016-02-06 LAB — CBC WITH DIFFERENTIAL/PLATELET
BASOS PCT: 0 %
Basophils Absolute: 0 10*3/uL (ref 0.0–0.1)
EOS PCT: 0 %
Eosinophils Absolute: 0 10*3/uL (ref 0.0–0.7)
HCT: 28.6 % — ABNORMAL LOW (ref 36.0–46.0)
Hemoglobin: 9.8 g/dL — ABNORMAL LOW (ref 12.0–15.0)
LYMPHS ABS: 0.8 10*3/uL (ref 0.7–4.0)
Lymphocytes Relative: 4 %
MCH: 29.3 pg (ref 26.0–34.0)
MCHC: 34.3 g/dL (ref 30.0–36.0)
MCV: 85.4 fL (ref 78.0–100.0)
MONO ABS: 0.4 10*3/uL (ref 0.1–1.0)
MONOS PCT: 2 %
NEUTROS PCT: 94 %
Neutro Abs: 17.9 10*3/uL — ABNORMAL HIGH (ref 1.7–7.7)
Platelets: 225 10*3/uL (ref 150–400)
RBC: 3.35 MIL/uL — AB (ref 3.87–5.11)
RDW: 13.8 % (ref 11.5–15.5)
WBC: 19.1 10*3/uL — ABNORMAL HIGH (ref 4.0–10.5)

## 2016-02-06 LAB — GLUCOSE, CAPILLARY: GLUCOSE-CAPILLARY: 116 mg/dL — AB (ref 65–99)

## 2016-02-06 LAB — RPR: RPR Ser Ql: NONREACTIVE

## 2016-02-06 MED ORDER — ONDANSETRON HCL 4 MG/2ML IJ SOLN: 4.0000 mg | INTRAMUSCULAR | Status: DC | PRN

## 2016-02-06 MED ORDER — LABETALOL HCL 100 MG PO TABS
100.0000 mg | ORAL_TABLET | Freq: Two times a day (BID) | ORAL | Status: DC
  Administered 2016-02-06 – 2016-02-07 (×3): 100 mg via ORAL
  Filled 2016-02-06 (×3): qty 1

## 2016-02-06 MED ORDER — TETANUS-DIPHTH-ACELL PERTUSSIS 5-2.5-18.5 LF-MCG/0.5 IM SUSP: 0.5000 mL | Freq: Once | INTRAMUSCULAR | Status: DC

## 2016-02-06 MED ORDER — IBUPROFEN 800 MG PO TABS
800.0000 mg | ORAL_TABLET | Freq: Three times a day (TID) | ORAL | Status: DC
  Administered 2016-02-06 – 2016-02-07 (×3): 800 mg via ORAL
  Filled 2016-02-06 (×3): qty 1

## 2016-02-06 MED ORDER — DIBUCAINE 1 % RE OINT
1.0000 | TOPICAL_OINTMENT | RECTAL | Status: DC | PRN
Start: 2016-02-06 — End: 2016-02-07

## 2016-02-06 MED ORDER — BENZOCAINE-MENTHOL 20-0.5 % EX AERO
1.0000 "application " | INHALATION_SPRAY | CUTANEOUS | Status: DC | PRN
  Administered 2016-02-07: 1 via TOPICAL
  Filled 2016-02-06 (×2): qty 56

## 2016-02-06 MED ORDER — ACETAMINOPHEN 325 MG PO TABS
650.0000 mg | ORAL_TABLET | ORAL | Status: DC | PRN
  Administered 2016-02-06 (×2): 650 mg via ORAL
  Filled 2016-02-06 (×2): qty 2

## 2016-02-06 MED ORDER — ZOLPIDEM TARTRATE 5 MG PO TABS: 5.0000 mg | ORAL_TABLET | Freq: Every evening | ORAL | Status: DC | PRN

## 2016-02-06 MED ORDER — DIPHENHYDRAMINE HCL 25 MG PO CAPS: 25.0000 mg | ORAL_CAPSULE | Freq: Four times a day (QID) | ORAL | Status: DC | PRN

## 2016-02-06 MED ORDER — SIMETHICONE 80 MG PO CHEW: 80.0000 mg | CHEWABLE_TABLET | ORAL | Status: DC | PRN

## 2016-02-06 MED ORDER — WITCH HAZEL-GLYCERIN EX PADS: 1.0000 "application " | MEDICATED_PAD | CUTANEOUS | Status: DC | PRN

## 2016-02-06 MED ORDER — PRENATAL MULTIVITAMIN CH
1.0000 | ORAL_TABLET | Freq: Every day | ORAL | Status: DC
  Administered 2016-02-06 – 2016-02-07 (×2): 1 via ORAL
  Filled 2016-02-06 (×2): qty 1

## 2016-02-06 MED ORDER — OXYTOCIN 10 UNIT/ML IJ SOLN: 2.5000 [IU]/h | INTRAVENOUS | Status: DC | PRN

## 2016-02-06 MED ORDER — IBUPROFEN 600 MG PO TABS
600.0000 mg | ORAL_TABLET | Freq: Four times a day (QID) | ORAL | Status: DC
  Administered 2016-02-06 (×2): 600 mg via ORAL
  Filled 2016-02-06 (×2): qty 1

## 2016-02-06 MED ORDER — SENNOSIDES-DOCUSATE SODIUM 8.6-50 MG PO TABS
2.0000 | ORAL_TABLET | ORAL | Status: DC
  Administered 2016-02-06: 2 via ORAL
  Filled 2016-02-06: qty 2

## 2016-02-06 MED ORDER — COCONUT OIL OIL
1.0000 | TOPICAL_OIL | Status: DC | PRN
Start: 2016-02-06 — End: 2016-02-07

## 2016-02-06 MED ORDER — OXYCODONE HCL 5 MG PO TABS
5.0000 mg | ORAL_TABLET | ORAL | Status: DC | PRN
  Administered 2016-02-06 – 2016-02-07 (×3): 5 mg via ORAL
  Filled 2016-02-06 (×3): qty 1

## 2016-02-06 MED ORDER — ONDANSETRON HCL 4 MG PO TABS: 4.0000 mg | ORAL_TABLET | ORAL | Status: DC | PRN

## 2016-02-06 NOTE — Anesthesia Postprocedure Evaluation (Signed)
Anesthesia Post Note  Patient: Chelsea Frost  Procedure(s) Performed: * No procedures listed *  Patient location during evaluation: Mother Baby Anesthesia Type: Epidural Level of consciousness: awake and alert and oriented Pain management: satisfactory to patient Vital Signs Assessment: post-procedure vital signs reviewed and stable Respiratory status: spontaneous breathing and nonlabored ventilation Cardiovascular status: stable Postop Assessment: no headache, no backache, no signs of nausea or vomiting, adequate PO intake and patient able to bend at knees (patient up walking) Anesthetic complications: no    Last Vitals:  Filed Vitals:   02/06/16 0535 02/06/16 0938  BP: 142/84 121/82  Pulse: 95 68  Temp: 36.8 C 36.9 C  Resp: 18 20    Last Pain:  Filed Vitals:   02/06/16 0948  PainSc: 3                  Joseguadalupe Stan

## 2016-02-06 NOTE — Lactation Note (Signed)
This note was copied from a baby's chart. Lactation Consultation Note  Patient Name: Chelsea Frost [name] UJWJX'BToday's Date: 02/06/2016 Reason for consult: Initial assessment Baby 14 hours old. Mom has DEBP set up at bedside and states that she is pumping every 2-3 hours. Mom reports that she got a few drops, which she gave to the baby with her finger, and then the baby "promptly threw up." Mom has room full of extended family visitors. Mom reports that she had a breast reduction 10 years ago, so she intends to feed the baby formula, which she reports he is tolerating well, and use DEBP to see if she can get her milk to come to volume. Enc mom to put baby to breast for stimulation--STS, nuzzling, and latching--in addition to giving formula. Mom given [facility] brochure, aware of OP/BFSG and LC phone line assistance after D/C.   Maternal Data    Feeding Feeding Type: Bottle Fed - Formula  LATCH Score/Interventions                      Lactation Tools Discussed/Used Pump Review: Setup, frequency, and cleaning;Milk Storage Initiated by:: bedside RN Date initiated:: 02/06/16   Consult Status Consult Status: Follow-up Date: 02/07/16 Follow-up type: In-patient    Geralynn OchsWILLIARD, Derricka Mertz 02/06/2016, 4:57 PM

## 2016-02-07 LAB — CBC
HCT: 23.9 % — ABNORMAL LOW (ref 36.0–46.0)
Hemoglobin: 8.1 g/dL — ABNORMAL LOW (ref 12.0–15.0)
MCH: 29 pg (ref 26.0–34.0)
MCHC: 33.9 g/dL (ref 30.0–36.0)
MCV: 85.7 fL (ref 78.0–100.0)
PLATELETS: 213 10*3/uL (ref 150–400)
RBC: 2.79 MIL/uL — AB (ref 3.87–5.11)
RDW: 13.9 % (ref 11.5–15.5)
WBC: 10 10*3/uL (ref 4.0–10.5)

## 2016-02-07 MED ORDER — IBUPROFEN 800 MG PO TABS: 800.0000 mg | ORAL_TABLET | Freq: Three times a day (TID) | ORAL | Status: DC

## 2016-02-07 MED ORDER — MAGNESIUM OXIDE 400 (241.3 MG) MG PO TABS
400.0000 mg | ORAL_TABLET | Freq: Every day | ORAL | Status: DC
  Administered 2016-02-07: 400 mg via ORAL
  Filled 2016-02-07 (×2): qty 1

## 2016-02-07 MED ORDER — POLYSACCHARIDE IRON COMPLEX 150 MG PO CAPS: 150.0000 mg | ORAL_CAPSULE | Freq: Every day | ORAL | Status: DC

## 2016-02-07 MED ORDER — MAGNESIUM OXIDE 400 (241.3 MG) MG PO TABS: 400.0000 mg | ORAL_TABLET | Freq: Every day | ORAL | Status: DC

## 2016-02-07 NOTE — Discharge Instructions (Signed)
Iron-Rich Diet Iron is a mineral that helps your body to produce hemoglobin. Hemoglobin is a protein in your red blood cells that carries oxygen to your body's tissues. Eating too little iron may cause you to feel weak and tired, and it can increase your risk for infection. Eating enough iron is necessary for your body's metabolism, muscle function, and nervous system. Iron is naturally found in many foods. It can also be added to foods or fortified in foods. There are two types of dietary iron:  Heme iron. Heme iron is absorbed by the body more easily than nonheme iron. Heme iron is found in meat, poultry, and fish.  Nonheme iron. Nonheme iron is found in dietary supplements, iron-fortified grains, beans, and vegetables. You may need to follow an iron-rich diet if:  You have been diagnosed with iron deficiency or iron-deficiency anemia.  You have a condition that prevents you from absorbing dietary iron, such as:  Infection in your intestines.  Celiac disease. This involves long-lasting (chronic) inflammation of your intestines.  You do not eat enough iron.  You eat a diet that is high in foods that impair iron absorption.  You have lost a lot of blood.  You have heavy bleeding during your menstrual cycle.  You are pregnant. WHAT IS MY PLAN? Your health care provider may help you to determine how much iron you need per day based on your condition. Generally, when a person consumes sufficient amounts of iron in the diet, the following iron needs are met:  Men.  14-18 years old: 11 mg per day.  19-50 years old: 8 mg per day.  Women.   14-18 years old: 15 mg per day.  19-50 years old: 18 mg per day.  Over 50 years old: 8 mg per day.  Pregnant women: 27 mg per day.  Breastfeeding women: 9 mg per day. WHAT DO I NEED TO KNOW ABOUT AN IRON-RICH DIET?  Eat fresh fruits and vegetables that are high in vitamin C along with foods that are high in iron. This will help increase  the amount of iron that your body absorbs from food, especially with foods containing nonheme iron. Foods that are high in vitamin C include oranges, peppers, tomatoes, and mango.  Take iron supplements only as directed by your health care provider. Overdose of iron can be life-threatening. If you were prescribed iron supplements, take them with orange juice or a vitamin C supplement.  Cook foods in pots and pans that are made from iron.   Eat nonheme iron-containing foods alongside foods that are high in heme iron. This helps to improve your iron absorption.   Certain foods and drinks contain compounds that impair iron absorption. Avoid eating these foods in the same meal as iron-rich foods or with iron supplements. These include:  Coffee, black tea, and red wine.  Milk, dairy products, and foods that are high in calcium.  Beans, soybeans, and peas.  Whole grains.  When eating foods that contain both nonheme iron and compounds that impair iron absorption, follow these tips to absorb iron better.   Soak beans overnight before cooking.  Soak whole grains overnight and drain them before using.  Ferment flours before baking, such as using yeast in bread dough. WHAT FOODS CAN I EAT? Grains Iron-fortified breakfast cereal. Iron-fortified whole-wheat bread. Enriched rice. Sprouted grains. Vegetables Spinach. Potatoes with skin. Green peas. Broccoli. Red and green bell peppers. Fermented vegetables. Fruits Prunes. Raisins. Oranges. Strawberries. Mango. Grapefruit. Meats and Other Protein   Sources Beef liver. Oysters. Beef. Shrimp. Turkey. Chicken. Tuna. Sardines. Chickpeas. Nuts. Tofu. Beverages Tomato juice. Fresh orange juice. Prune juice. Hibiscus tea. Fortified instant breakfast shakes. Condiments Tahini. Fermented soy sauce. Sweets and Desserts Black-strap molasses.  Other Wheat germ. The items listed above may not be a complete list of recommended foods or  beverages. Contact your dietitian for more options. WHAT FOODS ARE NOT RECOMMENDED? Grains Whole grains. Bran cereal. Bran flour. Oats. Vegetables Artichokes. Brussels sprouts. Kale. Fruits Blueberries. Raspberries. Strawberries. Figs. Meats and Other Protein Sources Soybeans. Products made from soy protein. Dairy Milk. Cream. Cheese. Yogurt. Cottage cheese. Beverages Coffee. Black tea. Red wine. Sweets and Desserts Cocoa. Chocolate. Ice cream. Other Basil. Oregano. Parsley. The items listed above may not be a complete list of foods and beverages to avoid. Contact your dietitian for more information.   This information is not intended to replace advice given to you by your health care provider. Make sure you discuss any questions you have with your health care provider.   Document Released: 05/26/2005 Document Revised: 11/02/2014 Document Reviewed: 05/09/2014 Elsevier Interactive Patient Education 2016 Elsevier Inc.  

## 2016-02-07 NOTE — Discharge Summary (Signed)
Obstetric Discharge Summary  Reason for Admission: induction of labor - GDMa2 and chronic HTN Prenatal Procedures: NST and ultrasound Intrapartum Procedures: spontaneous vaginal delivery Postpartum Procedures: none Complications-Operative and Postpartum: 1st degree perineal laceration HEMOGLOBIN  Date Value Ref Range Status  02/07/2016 8.1* 12.0 - 15.0 g/dL Final   HCT  Date Value Ref Range Status  02/07/2016 23.9* 36.0 - 46.0 % Final    Physical Exam:  General: alert, cooperative and no distress Lochia: appropriate Uterine Fundus: firm Perineum: healing well DVT Evaluation: No evidence of DVT seen on physical exam.  Discharge Diagnoses: Term Pregnancy-delivered  Discharge Information: Date: 02/07/2016 Activity: pelvic rest Diet: routine - low salt and low carb Medications: PNV, Ibuprofen, Iron and magnesium and labetalol Condition: stable Instructions: refer to practice specific booklet Discharge to: home Follow-up Information    Follow up with Chelsea Frost,MARIE-LYNE, MD. Schedule an appointment as soon as possible for a visit in 1 week.   Specialty:  Obstetrics and Gynecology   Why:  blood pressure recheck   Contact information:   [address] [place] [id number] [phone]     2 hour sugar test 6-12 weeks  Newborn Data: Live born female  Birth Weight: 7 lb 1.4 oz (3215 g) APGAR: 9, 9  Home with mother.  Chelsea Frost, Chelsea Frost 02/07/2016, 10:18 AM

## 2016-02-07 NOTE — Lactation Note (Signed)
This note was copied from a baby's chart. Lactation Consultation Note: Mother request for Lactation to see her piror to discharge.  Mother states she has a Spectra II at home.  She is concerned about the flange size she is using on the Symphony. Mother has been using the #27 which looks a little big.  She was advised to try the Spectra when she gets home . Advised to how to fit flange for proper size.  Discussed with mother the use of Reglan and good breast massage as well as frequent consistent pumping every 2-3 hours.  Mother was receptive to all teaching.  She is a ware of how to hand express. She states that she is unable to get drops from the (L) breast as of yet. Lots of support and encouragement given to mother.   Patient Name: Chelsea Ilene Quashley Petz ONGEX'BToday's Date: 02/07/2016 Reason for consult: Follow-up assessment   Maternal Data    Feeding Feeding Type: Formula Nipple Type: Slow - flow  LATCH Score/Interventions                      Lactation Tools Discussed/Used     Consult Status Consult Status: Complete    Michel BickersKendrick, Erianna Jolly McCoy 02/07/2016, 1:51 PM

## 2016-02-07 NOTE — Progress Notes (Addendum)
PPD 1 SVD with 1st degree repair  S:  Reports feeling well              Tolerating po/ No nausea or vomiting             Bleeding is light             Pain controlled with motrin and Tylenol             Up ad lib / ambulatory / voiding QS  Newborn breast feeding  / Circumcision planned today  O:   VS: BP 125/76 mmHg  Pulse 69  Temp(Src) 98.1 F (36.7 C) (Oral)  Resp 22  Ht 5\' 2"  (1.575 m)  Wt 97.07 kg (214 lb)  BMI 39.13 kg/m2  SpO2 100%  Breastfeeding? Unknown   LABS:              Recent Labs  02/06/16 0356 02/07/16 0503  WBC 19.1* 10.0  HGB 9.8* 8.1*  PLT 225 213               Blood type: --/--/A POS, A POS (04/12 0750)  Rubella: Immune (03/23 0000)                    Physical Exam:             Alert and oriented X3  Abdomen: soft, non-tender, non-distended              Fundus: firm, non-tender, U-1  Perineum: mild edema / ice pack in place  Lochia: light  Extremities: trace edema, no calf pain or tenderness  A: PPD # 1 with 1st degree repair             chronic hypertension - stable on Labetalol             IDA of pregnancy   Doing well - stable status             GDMa2 - off meds  P: Routine post partum orders  Iron and magnesium postpartum             Circumcision today - reviewed newborn care post-procedure             DC home this pm             2hr GTT 6-12 weeks  Marlinda MikeBAILEY, Hafiz Irion CNM, MSN, [facility] 02/07/2016, 10:12 AM

## 2017-01-21 ENCOUNTER — Other Ambulatory Visit: Payer: Self-pay | Admitting: Neurology

## 2017-01-21 DIAGNOSIS — R29898 Other symptoms and signs involving the musculoskeletal system: Secondary | ICD-10-CM

## 2017-01-21 DIAGNOSIS — R51 Headache: Principal | ICD-10-CM

## 2017-01-21 DIAGNOSIS — R519 Headache, unspecified: Secondary | ICD-10-CM

## 2017-02-03 ENCOUNTER — Other Ambulatory Visit: Payer: Managed Care, Other (non HMO)

## 2017-03-09 ENCOUNTER — Ambulatory Visit
Admission: RE | Admit: 2017-03-09 | Discharge: 2017-03-09 | Disposition: A | Discharge status: Home / Self Care | Payer: Managed Care, Other (non HMO) | Source: Ambulatory Visit | Attending: Neurology | Admitting: Neurology

## 2017-03-09 DIAGNOSIS — R29898 Other symptoms and signs involving the musculoskeletal system: Secondary | ICD-10-CM

## 2017-03-09 DIAGNOSIS — R519 Headache, unspecified: Secondary | ICD-10-CM

## 2017-03-09 DIAGNOSIS — R51 Headache: Principal | ICD-10-CM

## 2017-07-15 ENCOUNTER — Ambulatory Visit (INDEPENDENT_AMBULATORY_CARE_PROVIDER_SITE_OTHER): Payer: Managed Care, Other (non HMO) | Admitting: Neurology

## 2017-07-15 ENCOUNTER — Encounter: Payer: Self-pay | Admitting: Neurology

## 2017-07-15 DIAGNOSIS — G43709 Chronic migraine without aura, not intractable, without status migrainosus: Secondary | ICD-10-CM | POA: Diagnosis not present

## 2017-07-15 DIAGNOSIS — IMO0002 Reserved for concepts with insufficient information to code with codable children: Secondary | ICD-10-CM

## 2017-07-15 MED ORDER — ONDANSETRON 4 MG PO TBDP: 4.0000 mg | ORAL_TABLET | Freq: Three times a day (TID) | ORAL | 6 refills | Status: DC | PRN

## 2017-07-15 MED ORDER — BUTALBITAL-APAP-CAFFEINE 50-325-40 MG PO TABS: 1.0000 | ORAL_TABLET | Freq: Four times a day (QID) | ORAL | 5 refills | Status: DC | PRN

## 2017-07-15 MED ORDER — NARATRIPTAN HCL 2.5 MG PO TABS: 2.5000 mg | ORAL_TABLET | ORAL | 11 refills | Status: DC | PRN

## 2017-07-15 NOTE — Patient Instructions (Signed)
Magnesium oxide 400 mg twice a day Riboflavin 100 mg twice a day= vitamin B2 

## 2017-07-15 NOTE — Progress Notes (Signed)
PATIENT: Chelsea Frost DOB: [birth year]  Chief Complaint  Patient presents with  . Migraine    She is currently trying to get pregnant. She has been treated previously at [facility].  In a normal month, she usually just has one migraine per month.  However, she has noted an increase in tension and migraine headaches over the last six weeks.  She is currently taking Topamax  at bedtime.  She uses Amerge, Aleve or Skelaxin for pain management, depending on the severity of pain.  Marland Kitchen PCP    Devra Dopp, MD     HISTORICAL  Chelsea Frost is a 36 year old female, seen in refer by  her primary care doctor Devra Dopp  for evaluation of migraine headaches, initial evaluation was on July 15 2017.  Reviewed and summarized the referring note, she had a history of  obesity, impaired glucose intolerance, hypertension, vitamin D deficiency, anxiety disorder, acid reflux, chronic migraine headaches,  She had migraine headaches since twenties, her typical migraine starting from neck, spreading forward, become severe pounding headache, with high-pitched noise, smell sensitivity, nauseous, mild light sensitivity, movement make her headache worse,  She was under the care of  headache and neck pain clinic by Dr.Lewit, most recent visit was on Mar 24 2017.  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 Imitrex [Sumatriptan] Other (See Comments)    Jaw issues   . Amoxicillin Rash    HOME MEDICATIONS: Current Outpatient Prescriptions  Medication Sig Dispense Refill  . B Complex Vitamins (B COMPLEX 1 PO) Take by mouth.    . busPIRone (BUSPAR) 5 MG tablet Take by mouth.    . Cholecalciferol (VITAMIN D3) 5000 units TABS Take by mouth.    . citalopram (CELEXA) 40 MG tablet Take by mouth.    . dicyclomine (BENTYL) 20 MG tablet Take 20 mg by mouth daily.    . Docosanol 10 % CREA as needed.    Marland Kitchen esomeprazole (NEXIUM) 40 MG capsule Take 40 mg by mouth daily at 12 noon.    . folic acid (FOLVITE) 1 MG tablet Take 1 mg by mouth daily.  11  . Ibuprofen (ADVIL PO) Take by mouth as needed.    Marland Kitchen levocetirizine (XYZAL) 5 MG tablet Take 5 mg by mouth every evening.    . metaxalone (SKELAXIN) 400 MG tablet Take by mouth.    . metoprolol succinate (TOPROL-XL) 50 MG 24 hr tablet Take 100 mg by mouth daily.  0  . Naproxen Sodium (ALEVE PO) Take by mouth as needed.    . naratriptan (AMERGE) 2.5 MG tablet Take by mouth.    Marland Kitchen oxiconazole (OXISTAT) 1 % CREA as needed.  0  . topiramate (TOPAMAX) 100 MG tablet Take 100 mg by mouth daily.  2  . UNABLE TO FIND Med Name: Lidocaine Nasal spray 1 spray in effected side  repeat after 10 minutes if needed    . valACYclovir (VALTREX) 1000 MG tablet Take by mouth as needed.     No current facility-administered medications for this visit.     PAST MEDICAL HISTORY: Past Medical History:  Diagnosis Date  . Anxiety   . Eczema   . GERD (gastroesophageal reflux disease)   . Gestational diabetes    glyburide  . Gestational diabetes mellitus (GDM), antepartum   . Hx of varicella   . Hypertension    labetolol 200 mg bid  . Migraine     PAST SURGICAL HISTORY: Past Surgical History:  Procedure Laterality Date  . BREAST REDUCTION SURGERY    . KNEE SURGERY Left   . WISDOM TOOTH  EXTRACTION      FAMILY HISTORY: Family History  Problem Relation Age of Onset  . Lupus Father        lupus anticoagulant  . Pulmonary embolism Father   . Multiple sclerosis Paternal Aunt   . COPD Maternal Grandmother   . Cancer Maternal Grandfather        colon  . Hiatal hernia Mother     SOCIAL HISTORY:  Social History   Social History  . Marital status: Married    Spouse name: N/A  . Number of children: 1  . Years of education: Masters   Occupational History  . Speech/Language Pathologist    Social History Main Topics  . Smoking status: Never Smoker  . Smokeless tobacco: Never Used  . Alcohol use Yes     Comment: Occasional use  . Drug use: No  . Sexual activity: Not on file   Other Topics Concern  . Not on file   Social History Narrative   Lives at home with her husband and child.   Right-handed.   Two 12oz bottles of Diet Dr. Reino Kent per day.     PHYSICAL EXAM   Vitals:   07/15/17 0837  BP: 120/82  Pulse: 77  Weight: 215 lb 12 oz (97.9 kg)  Height:  (1.575 m)    Not recorded      Body mass index is 39.46 kg/m.  PHYSICAL EXAMNIATION:  Gen: NAD, conversant, well nourised, obese, well groomed                     Cardiovascular: Regular rate rhythm, no peripheral edema, warm, nontender. Eyes: Conjunctivae clear without exudates or hemorrhage Neck: Supple, no carotid bruits. Pulmonary: Clear to auscultation bilaterally   NEUROLOGICAL EXAM:  MENTAL STATUS: Speech:    Speech is normal; fluent and spontaneous with normal comprehension.  Cognition:     Orientation to time, place and person     Normal recent and remote memory     Normal Attention span and concentration     Normal Language, naming, repeating,spontaneous speech     Fund of knowledge   CRANIAL NERVES: CN II: Visual fields are full to confrontation. Fundoscopic exam is normal with sharp discs and no vascular changes. Pupils are round equal and briskly reactive to  light. CN III, IV, VI: extraocular movement are normal. No ptosis. CN V: Facial sensation is intact to pinprick in all 3 divisions bilaterally. Corneal responses are intact.  CN VII: Face is symmetric with normal eye closure and smile. CN VIII: Hearing is normal to rubbing fingers CN IX, X: Palate elevates symmetrically. Phonation is normal. CN XI: Head turning and shoulder shrug are intact CN XII: Tongue is midline with normal movements and no atrophy.  MOTOR: There is no pronator drift of out-stretched arms. Muscle bulk and tone are normal. Muscle strength is normal.  REFLEXES: Reflexes are 2+ and symmetric at the biceps, triceps, knees, and ankles. Plantar responses are flexor.  SENSORY: Intact to light touch, pinprick, positional sensation and vibratory sensation are intact in fingers and toes.  COORDINATION: Rapid alternating movements and fine finger movements are intact. There is no dysmetria on finger-to-nose and heel-knee-shin.    GAIT/STANCE: Posture is normal. Gait is steady with normal steps, base, arm swing, and turning. Heel and toe walking are normal. Tandem gait is normal.  Romberg is absent.   DIAGNOSTIC DATA (LABS, IMAGING, TESTING) - I reviewed patient records, labs, notes, testing and imaging myself where available.   ASSESSMENT AND PLAN  Chelsea Frost is a 36 y.o. female    Chronic migraine headache,   planning on to get pregnant  Stop Topamax 100 mg every night  Start preventive medications magnesium oxide 400 mg twice a day, riboflavin 100 mg twice a day  Continue Amerge as needed before pregnant, may take Zofran, and Fioricet as needed during pregnancy  Anxiety    Levert Feinstein, M.D. Ph.D.  [facility] [address] [place], [place] [address] Ph: [phone] Fax: [phone]  CC: Devra Dopp, MD

## 2017-07-22 ENCOUNTER — Encounter: Payer: Self-pay | Admitting: Neurology

## 2017-08-24 LAB — OB RESULTS CONSOLE RPR: RPR: NONREACTIVE

## 2017-08-24 LAB — OB RESULTS CONSOLE HEPATITIS B SURFACE ANTIGEN: Hepatitis B Surface Ag: NEGATIVE

## 2017-08-24 LAB — OB RESULTS CONSOLE ABO/RH: RH TYPE: POSITIVE

## 2017-08-24 LAB — OB RESULTS CONSOLE RUBELLA ANTIBODY, IGM: RUBELLA: IMMUNE

## 2017-08-24 LAB — OB RESULTS CONSOLE GC/CHLAMYDIA
Chlamydia: NEGATIVE
Gonorrhea: NEGATIVE

## 2017-08-24 LAB — OB RESULTS CONSOLE HIV ANTIBODY (ROUTINE TESTING): HIV: NONREACTIVE

## 2017-08-24 LAB — OB RESULTS CONSOLE ANTIBODY SCREEN: Antibody Screen: NEGATIVE

## 2017-10-26 NOTE — L&D Delivery Note (Signed)
Delivery Note At 2:35 PM a viable and healthy female was delivered via Vaginal, Spontaneous (Presentation:OP ).  APGAR: 8, 9; weight 6 lb 13.9 oz (3115 g).   Placenta status: spontaneious, intact.  Cord:  with the following complications: none.  Cord pH: na  Anesthesia:  epidural Episiotomy: None Lacerations: None Suture Repair: na Est. Blood Loss (mL): 300  Mom to postpartum.  Baby to Couplet care / Skin to Skin.  Suhas Estis J 03/11/2018, 3:56 PM

## 2017-11-04 ENCOUNTER — Encounter: Payer: Self-pay | Admitting: Neurology

## 2018-01-12 ENCOUNTER — Ambulatory Visit: Payer: Managed Care, Other (non HMO) | Admitting: Neurology

## 2018-01-13 ENCOUNTER — Inpatient Hospital Stay (HOSPITAL_COMMUNITY)
Admission: AD | Admit: 2018-01-13 | Discharge: 2018-01-13 | Disposition: A | Discharge status: Home / Self Care | Payer: Managed Care, Other (non HMO) | Source: Ambulatory Visit | Attending: Obstetrics and Gynecology | Admitting: Obstetrics and Gynecology

## 2018-01-13 ENCOUNTER — Other Ambulatory Visit: Payer: Self-pay

## 2018-01-13 DIAGNOSIS — O212 Late vomiting of pregnancy: Secondary | ICD-10-CM | POA: Diagnosis present

## 2018-01-13 DIAGNOSIS — Z825 Family history of asthma and other chronic lower respiratory diseases: Secondary | ICD-10-CM | POA: Diagnosis not present

## 2018-01-13 DIAGNOSIS — Z880 Allergy status to penicillin: Secondary | ICD-10-CM | POA: Insufficient documentation

## 2018-01-13 DIAGNOSIS — Z800 Family history of malignant neoplasm of digestive organs: Secondary | ICD-10-CM | POA: Insufficient documentation

## 2018-01-13 DIAGNOSIS — O163 Unspecified maternal hypertension, third trimester: Secondary | ICD-10-CM | POA: Diagnosis not present

## 2018-01-13 DIAGNOSIS — O99343 Other mental disorders complicating pregnancy, third trimester: Secondary | ICD-10-CM | POA: Insufficient documentation

## 2018-01-13 DIAGNOSIS — Z9889 Other specified postprocedural states: Secondary | ICD-10-CM | POA: Insufficient documentation

## 2018-01-13 DIAGNOSIS — K219 Gastro-esophageal reflux disease without esophagitis: Secondary | ICD-10-CM | POA: Diagnosis not present

## 2018-01-13 DIAGNOSIS — R0602 Shortness of breath: Secondary | ICD-10-CM | POA: Diagnosis not present

## 2018-01-13 DIAGNOSIS — Z791 Long term (current) use of non-steroidal anti-inflammatories (NSAID): Secondary | ICD-10-CM | POA: Insufficient documentation

## 2018-01-13 DIAGNOSIS — O26893 Other specified pregnancy related conditions, third trimester: Secondary | ICD-10-CM | POA: Diagnosis not present

## 2018-01-13 DIAGNOSIS — Z3A29 29 weeks gestation of pregnancy: Secondary | ICD-10-CM | POA: Insufficient documentation

## 2018-01-13 DIAGNOSIS — Z888 Allergy status to other drugs, medicaments and biological substances status: Secondary | ICD-10-CM | POA: Diagnosis not present

## 2018-01-13 DIAGNOSIS — O4703 False labor before 37 completed weeks of gestation, third trimester: Secondary | ICD-10-CM | POA: Diagnosis not present

## 2018-01-13 DIAGNOSIS — F419 Anxiety disorder, unspecified: Secondary | ICD-10-CM | POA: Diagnosis not present

## 2018-01-13 DIAGNOSIS — O99613 Diseases of the digestive system complicating pregnancy, third trimester: Secondary | ICD-10-CM | POA: Insufficient documentation

## 2018-01-13 DIAGNOSIS — Z8249 Family history of ischemic heart disease and other diseases of the circulatory system: Secondary | ICD-10-CM | POA: Diagnosis not present

## 2018-01-13 DIAGNOSIS — Z79899 Other long term (current) drug therapy: Secondary | ICD-10-CM | POA: Diagnosis not present

## 2018-01-13 DIAGNOSIS — O219 Vomiting of pregnancy, unspecified: Secondary | ICD-10-CM

## 2018-01-13 LAB — CBC
HCT: 29.4 % — ABNORMAL LOW (ref 36.0–46.0)
HEMOGLOBIN: 10.1 g/dL — AB (ref 12.0–15.0)
MCH: 29.4 pg (ref 26.0–34.0)
MCHC: 34.4 g/dL (ref 30.0–36.0)
MCV: 85.7 fL (ref 78.0–100.0)
PLATELETS: 185 10*3/uL (ref 150–400)
RBC: 3.43 MIL/uL — AB (ref 3.87–5.11)
RDW: 14 % (ref 11.5–15.5)
WBC: 8.1 10*3/uL (ref 4.0–10.5)

## 2018-01-13 LAB — URINALYSIS, ROUTINE W REFLEX MICROSCOPIC
BILIRUBIN URINE: NEGATIVE
Glucose, UA: NEGATIVE mg/dL
Hgb urine dipstick: NEGATIVE
KETONES UR: 40 mg/dL — AB
NITRITE: NEGATIVE
PROTEIN: NEGATIVE mg/dL
Specific Gravity, Urine: <1.005 — ABNORMAL LOW (ref 1.005–1.030)
pH: 6.5 (ref 5.0–8.0)

## 2018-01-13 LAB — URINALYSIS, MICROSCOPIC (REFLEX)

## 2018-01-13 LAB — FETAL FIBRONECTIN: FETAL FIBRONECTIN: NEGATIVE

## 2018-01-13 LAB — COMPREHENSIVE METABOLIC PANEL
ALBUMIN: 3.1 g/dL — AB (ref 3.5–5.0)
ALK PHOS: 80 U/L (ref 38–126)
ALT: 10 U/L — ABNORMAL LOW (ref 14–54)
ANION GAP: 8 (ref 5–15)
AST: 14 U/L — ABNORMAL LOW (ref 15–41)
BUN: 5 mg/dL — ABNORMAL LOW (ref 6–20)
CALCIUM: 8.3 mg/dL — AB (ref 8.9–10.3)
CHLORIDE: 102 mmol/L (ref 101–111)
CO2: 19 mmol/L — AB (ref 22–32)
Creatinine, Ser: 0.61 mg/dL (ref 0.44–1.00)
GFR calc Af Amer: >60 mL/min (ref >60)
GFR calc non Af Amer: >60 mL/min (ref >60)
GLUCOSE: 99 mg/dL (ref 65–99)
Potassium: 3.2 mmol/L — ABNORMAL LOW (ref 3.5–5.1)
SODIUM: 129 mmol/L — AB (ref 135–145)
Total Bilirubin: 0.7 mg/dL (ref 0.3–1.2)
Total Protein: 6.2 g/dL — ABNORMAL LOW (ref 6.5–8.1)

## 2018-01-13 MED ORDER — LACTATED RINGERS IV BOLUS (SEPSIS)
1000.0000 mL | Freq: Once | INTRAVENOUS | Status: AC
  Administered 2018-01-13: 1000 mL via INTRAVENOUS

## 2018-01-13 NOTE — MAU Provider Note (Signed)
History     CSN: [id number]  Arrival date and time: 01/13/18 1547   First Provider Initiated Contact with Patient 01/13/18 1707     Chief Complaint  Patient presents with  . Emesis  . Nausea  . Body Aches   HPI Chelsea Frost is a 37 y.o. G2P1001 at [redacted]w[redacted]d who presents with nausea, vomiting and body aches that started last night. She states she threw up two times and felt nauseous but has had no vomiting since 2am. She went to her PCP today who was concerned she may have an "electrolyte imbalance" and sent her to MAU to be evaluated. She also reports sternal chest pain and shortness of breath since last night. She denies any leaking or bleeding. Reports intermittent cramping and good fetal movement.   OB History    Gravida  2   Para  1   Term  1   Preterm  0   AB  0   Living  1     SAB  0   TAB  0   Ectopic  0   Multiple  0   Live Births  1           Past Medical History:  Diagnosis Date  . Anxiety   . Eczema   . GERD (gastroesophageal reflux disease)   . Gestational diabetes    glyburide  . Gestational diabetes mellitus (GDM), antepartum   . Hx of varicella   . Hypertension    labetolol 200 mg bid  . Migraine     Past Surgical History:  Procedure Laterality Date  . BREAST REDUCTION SURGERY    . KNEE SURGERY Left   . WISDOM TOOTH EXTRACTION      Family History  Problem Relation Age of Onset  . Lupus Father        lupus anticoagulant  . Pulmonary embolism Father   . Hiatal hernia Father   . Arrhythmia Father   . Multiple sclerosis Paternal Aunt   . COPD Maternal Grandmother   . Cancer Maternal Grandfather        colon    Social History   Tobacco Use  . Smoking status: Never Smoker  . Smokeless tobacco: Never Used  Substance Use Topics  . Alcohol use: Yes    Comment: Occasional use  . Drug use: No    Allergies:  Allergies  Allergen Reactions  . Imitrex [Sumatriptan] Other (See Comments)    Jaw issues   . Amoxicillin  Rash    Medications Prior to Admission  Medication Sig Dispense Refill Last Dose  . B Complex Vitamins (B COMPLEX 1 PO) Take by mouth.   Taking  . busPIRone (BUSPAR) 5 MG tablet Take by mouth.   Taking  . butalbital-acetaminophen-caffeine (FIORICET, ESGIC) 50-325-40 MG tablet Take 1 tablet by mouth every 6 (six) hours as needed for headache. 15 tablet 5   . Cholecalciferol (VITAMIN D3) 5000 units TABS Take by mouth.   Taking  . citalopram (CELEXA) 40 MG tablet Take by mouth.   Taking  . dicyclomine (BENTYL) 20 MG tablet Take 20 mg by mouth daily.   Taking  . Docosanol 10 % CREA as needed.   Taking  . esomeprazole (NEXIUM) 40 MG capsule Take 40 mg by mouth daily at 12 noon.   Taking  . folic acid (FOLVITE) 1 MG tablet Take 1 mg by mouth daily.  11 Taking  . Ibuprofen (ADVIL PO) Take by mouth as needed.   Taking  .  levocetirizine (XYZAL) 5 MG tablet Take 5 mg by mouth every evening.   Taking  . metaxalone (SKELAXIN) 400 MG tablet Take by mouth.   Taking  . metoprolol succinate (TOPROL-XL) 50 MG 24 hr tablet Take 100 mg by mouth daily.  0 Taking  . Naproxen Sodium (ALEVE PO) Take by mouth as needed.   Taking  . naratriptan (AMERGE) 2.5 MG tablet Take 1 tablet (2.5 mg total) by mouth as needed for migraine. 9 tablet 11   . ondansetron (ZOFRAN ODT) 4 MG disintegrating tablet Take 1 tablet (4 mg total) by mouth every 8 (eight) hours as needed. 20 tablet 6   . oxiconazole (OXISTAT) 1 % CREA as needed.  0 Taking  . UNABLE TO FIND Med Name: Lidocaine Nasal spray 1 spray in effected side repeat after 10 minutes if needed   Taking  . valACYclovir (VALTREX) 1000 MG tablet Take by mouth as needed.   Taking    Review of Systems  Constitutional: Negative.  Negative for fatigue and fever.  HENT: Negative.   Respiratory: Positive for shortness of breath.   Cardiovascular: Positive for chest pain.  Gastrointestinal: Positive for nausea and vomiting. Negative for abdominal pain, constipation and  diarrhea.  Genitourinary: Negative.  Negative for dysuria.  Neurological: Negative.  Negative for dizziness and headaches.   Physical Exam   Blood pressure 134/84, pulse (!) 114, temperature 98.8 F (37.1 C), temperature source Oral, resp. rate 20, height 5' 2.75" (1.594 m), weight 201 lb 8 oz (91.4 kg), SpO2 100 %, unknown if currently breastfeeding.  Physical Exam  Nursing note and vitals reviewed. Constitutional: She is oriented to person, place, and time. She appears well-developed and well-nourished. No distress.  HENT:  Head: Normocephalic.  Eyes: Pupils are equal, round, and reactive to light.  Cardiovascular: Normal rate, regular rhythm and normal heart sounds.  Respiratory: Effort normal and breath sounds normal. No respiratory distress.  GI: Soft. Bowel sounds are normal. She exhibits no distension. There is no tenderness.  Neurological: She is alert and oriented to person, place, and time.  Skin: Skin is warm and dry.  Psychiatric: She has a normal mood and affect. Her behavior is normal. Judgment and thought content normal.   Fetal Tracing:  Baseline: 135 Variability: moderate Accels: 15x15 Decels: none  Toco: q4-5  Dilation: Closed Effacement (%): Thick Cervical Position: Posterior Exam by:: Ma Hillock. Neill CNM   MAU Course  Procedures Results for orders placed or performed during the hospital encounter of 01/13/18 (from the past 24 hour(s))  Urinalysis, Routine w reflex microscopic     Status: Abnormal   Collection Time: 01/13/18  4:20 PM  Result Value Ref Range   Color, Urine YELLOW YELLOW   APPearance HAZY (A) CLEAR   Specific Gravity, Urine <1.005 (L) 1.005 - 1.030   pH 6.5 5.0 - 8.0   Glucose, UA NEGATIVE NEGATIVE mg/dL   Hgb urine dipstick NEGATIVE NEGATIVE   Bilirubin Urine NEGATIVE NEGATIVE   Ketones, ur 40 (A) NEGATIVE mg/dL   Protein, ur NEGATIVE NEGATIVE mg/dL   Nitrite NEGATIVE NEGATIVE   Leukocytes, UA MODERATE (A) NEGATIVE  Urinalysis,  Microscopic (reflex)     Status: Abnormal   Collection Time: 01/13/18  4:20 PM  Result Value Ref Range   RBC / HPF 0-5 0 - 5 RBC/hpf   WBC, UA 0-5 0 - 5 WBC/hpf   Bacteria, UA FEW (A) NONE SEEN   Squamous Epithelial / LPF 0-5 (A) NONE SEEN  CBC  Status: Abnormal   Collection Time: 01/13/18  5:13 PM  Result Value Ref Range   WBC 8.1 4.0 - 10.5 K/uL   RBC 3.43 (L) 3.87 - 5.11 MIL/uL   Hemoglobin 10.1 (L) 12.0 - 15.0 g/dL   HCT 16.1 (L) 09.6 - 04.5 %   MCV 85.7 78.0 - 100.0 fL   MCH 29.4 26.0 - 34.0 pg   MCHC 34.4 30.0 - 36.0 g/dL   RDW 40.9 81.1 - 91.4 %   Platelets 185 150 - 400 K/uL  Comprehensive metabolic panel     Status: Abnormal   Collection Time: 01/13/18  5:13 PM  Result Value Ref Range   Sodium 129 (L) 135 - 145 mmol/L   Potassium 3.2 (L) 3.5 - 5.1 mmol/L   Chloride 102 101 - 111 mmol/L   CO2 19 (L) 22 - 32 mmol/L   Glucose, Bld 99 65 - 99 mg/dL   BUN 5 (L) 6 - 20 mg/dL   Creatinine, Ser 7.82 0.44 - 1.00 mg/dL   Calcium 8.3 (L) 8.9 - 10.3 mg/dL   Total Protein 6.2 (L) 6.5 - 8.1 g/dL   Albumin 3.1 (L) 3.5 - 5.0 g/dL   AST 14 (L) 15 - 41 U/L   ALT 10 (L) 14 - 54 U/L   Alkaline Phosphatase 80 38 - 126 U/L   Total Bilirubin 0.7 0.3 - 1.2 mg/dL   GFR calc non Af Amer >60 >60 mL/min   GFR calc Af Amer >60 >60 mL/min   Anion gap 8 5 - 15  Fetal fibronectin     Status: None   Collection Time: 01/13/18  6:15 PM  Result Value Ref Range   Fetal Fibronectin NEGATIVE NEGATIVE    MDM UA CBC, CMP LR bolus ED EKG- consulted with cardiology, normal EKG Contractions resolved with IV fluids  Consulted with Dr. Amado Nash- will send FFN and can discharge patient home if negative and contractions resolved  FFN- negative Contractions resolved  Assessment and Plan   1. Preterm uterine contractions in third trimester, antepartum   2. Nausea and vomiting during pregnancy   3. [redacted] weeks gestation of pregnancy    -Discharge home in stable condition -Preterm labor  precautions discussed -Patient advised to follow-up with [facility] tomorrow as scheduled for prenatal care -Patient may return to MAU as needed or if her condition were to change or worsen  Rolm Bookbinder CNM 01/13/2018, 5:12 PM

## 2018-01-13 NOTE — MAU Note (Signed)
Pt presents with c/o N&V, reports vomited several times, stopped @ 0230 this morning.  Denies diarrhea.  Also reports presence of body aches.  Seen @ PCP, flu negative.  Instructed to be seen in MAU by PCP for possible "fluid imbalance" Denies VB or LOF. Reports +FM.

## 2018-01-13 NOTE — Discharge Instructions (Signed)
Braxton Hicks Contractions °Contractions of the uterus can occur throughout pregnancy, but they are not always a sign that you are in labor. You may have practice contractions called Braxton Hicks contractions. These false labor contractions are sometimes confused with true labor. °What are Braxton Hicks contractions? °Braxton Hicks contractions are tightening movements that occur in the muscles of the uterus before labor. Unlike true labor contractions, these contractions do not result in opening (dilation) and thinning of the cervix. Toward the end of pregnancy (32-34 weeks), Braxton Hicks contractions can happen more often and may become stronger. These contractions are sometimes difficult to tell apart from true labor because they can be very uncomfortable. You should not feel embarrassed if you go to the hospital with false labor. °Sometimes, the only way to tell if you are in true labor is for your health care provider to look for changes in the cervix. The health care provider will do a physical exam and may monitor your contractions. If you are not in true labor, the exam should show that your cervix is not dilating and your water has not broken. °If there are other health problems associated with your pregnancy, it is completely safe for you to be sent home with false labor. You may continue to have Braxton Hicks contractions until you go into true labor. °How to tell the difference between true labor and false labor °True labor °· Contractions last 30-70 seconds. °· Contractions become very regular. °· Discomfort is usually felt in the top of the uterus, and it spreads to the lower abdomen and low back. °· Contractions do not go away with walking. °· Contractions usually become more intense and increase in frequency. °· The cervix dilates and gets thinner. °False labor °· Contractions are usually shorter and not as strong as true labor contractions. °· Contractions are usually irregular. °· Contractions  are often felt in the front of the lower abdomen and in the groin. °· Contractions may go away when you walk around or change positions while lying down. °· Contractions get weaker and are shorter-lasting as time goes on. °· The cervix usually does not dilate or become thin. °Follow these instructions at home: °· Take over-the-counter and prescription medicines only as told by your health care provider. °· Keep up with your usual exercises and follow other instructions from your health care provider. °· Eat and drink lightly if you think you are going into labor. °· If Braxton Hicks contractions are making you uncomfortable: °? Change your position from lying down or resting to walking, or change from walking to resting. °? Sit and rest in a tub of warm water. °? Drink enough fluid to keep your urine pale yellow. Dehydration may cause these contractions. °? Do slow and deep breathing several times an hour. °· Keep all follow-up prenatal visits as told by your health care provider. This is important. °Contact a health care provider if: °· You have a fever. °· You have continuous pain in your abdomen. °Get help right away if: °· Your contractions become stronger, more regular, and closer together. °· You have fluid leaking or gushing from your vagina. °· You pass blood-tinged mucus (bloody show). °· You have bleeding from your vagina. °· You have low back pain that you never had before. °· You feel your baby’s head pushing down and causing pelvic pressure. °· Your baby is not moving inside you as much as it used to. °Summary °· Contractions that occur before labor are called Braxton   Hicks contractions, false labor, or practice contractions. °· Braxton Hicks contractions are usually shorter, weaker, farther apart, and less regular than true labor contractions. True labor contractions usually become progressively stronger and regular and they become more frequent. °· Manage discomfort from Braxton Hicks contractions by  changing position, resting in a warm bath, drinking plenty of water, or practicing deep breathing. °This information is not intended to replace advice given to you by your health care provider. Make sure you discuss any questions you have with your health care provider. °Document Released: 02/25/2017 Document Revised: 02/25/2017 Document Reviewed: 02/25/2017 °Elsevier Interactive Patient Education © 2018 [employer] ° °

## 2018-02-23 LAB — OB RESULTS CONSOLE GBS: GBS: NEGATIVE

## 2018-02-28 ENCOUNTER — Encounter (HOSPITAL_COMMUNITY): Payer: Self-pay | Admitting: *Deleted

## 2018-02-28 ENCOUNTER — Inpatient Hospital Stay (HOSPITAL_COMMUNITY)
Admission: AD | Admit: 2018-02-28 | Discharge: 2018-02-28 | Disposition: A | Discharge status: Home / Self Care | Payer: Managed Care, Other (non HMO) | Source: Ambulatory Visit | Attending: Obstetrics and Gynecology | Admitting: Obstetrics and Gynecology

## 2018-02-28 DIAGNOSIS — Z880 Allergy status to penicillin: Secondary | ICD-10-CM | POA: Diagnosis not present

## 2018-02-28 DIAGNOSIS — R519 Headache, unspecified: Secondary | ICD-10-CM

## 2018-02-28 DIAGNOSIS — Z7982 Long term (current) use of aspirin: Secondary | ICD-10-CM | POA: Diagnosis not present

## 2018-02-28 DIAGNOSIS — R51 Headache: Secondary | ICD-10-CM

## 2018-02-28 DIAGNOSIS — O10919 Unspecified pre-existing hypertension complicating pregnancy, unspecified trimester: Secondary | ICD-10-CM

## 2018-02-28 DIAGNOSIS — O10913 Unspecified pre-existing hypertension complicating pregnancy, third trimester: Secondary | ICD-10-CM | POA: Diagnosis not present

## 2018-02-28 DIAGNOSIS — Z3A35 35 weeks gestation of pregnancy: Secondary | ICD-10-CM | POA: Diagnosis not present

## 2018-02-28 DIAGNOSIS — O10013 Pre-existing essential hypertension complicating pregnancy, third trimester: Secondary | ICD-10-CM | POA: Insufficient documentation

## 2018-02-28 LAB — COMPREHENSIVE METABOLIC PANEL
ALBUMIN: 3 g/dL — AB (ref 3.5–5.0)
ALT: 9 U/L — ABNORMAL LOW (ref 14–54)
ANION GAP: 9 (ref 5–15)
AST: 14 U/L — ABNORMAL LOW (ref 15–41)
Alkaline Phosphatase: 119 U/L (ref 38–126)
BUN: 6 mg/dL (ref 6–20)
CHLORIDE: 107 mmol/L (ref 101–111)
CO2: 19 mmol/L — AB (ref 22–32)
Calcium: 8.8 mg/dL — ABNORMAL LOW (ref 8.9–10.3)
Creatinine, Ser: 0.44 mg/dL (ref 0.44–1.00)
GFR calc Af Amer: >60 mL/min (ref >60)
GFR calc non Af Amer: >60 mL/min (ref >60)
GLUCOSE: 94 mg/dL (ref 65–99)
POTASSIUM: 3.9 mmol/L (ref 3.5–5.1)
SODIUM: 135 mmol/L (ref 135–145)
Total Bilirubin: 0.2 mg/dL — ABNORMAL LOW (ref 0.3–1.2)
Total Protein: 6.8 g/dL (ref 6.5–8.1)

## 2018-02-28 LAB — URINALYSIS, ROUTINE W REFLEX MICROSCOPIC
Bilirubin Urine: NEGATIVE
Glucose, UA: NEGATIVE mg/dL
Hgb urine dipstick: NEGATIVE
KETONES UR: NEGATIVE mg/dL
Nitrite: NEGATIVE
PH: 7 (ref 5.0–8.0)
PROTEIN: NEGATIVE mg/dL
Specific Gravity, Urine: 1.008 (ref 1.005–1.030)

## 2018-02-28 LAB — CBC
HCT: 28.7 % — ABNORMAL LOW (ref 36.0–46.0)
HEMOGLOBIN: 9.8 g/dL — AB (ref 12.0–15.0)
MCH: 29.3 pg (ref 26.0–34.0)
MCHC: 34.1 g/dL (ref 30.0–36.0)
MCV: 85.9 fL (ref 78.0–100.0)
PLATELETS: 238 10*3/uL (ref 150–400)
RBC: 3.34 MIL/uL — ABNORMAL LOW (ref 3.87–5.11)
RDW: 14.3 % (ref 11.5–15.5)
WBC: 8.6 10*3/uL (ref 4.0–10.5)

## 2018-02-28 LAB — PROTEIN / CREATININE RATIO, URINE
Creatinine, Urine: 58 mg/dL
PROTEIN CREATININE RATIO: 0.19 mg/mg{creat} — AB (ref 0.00–0.15)
TOTAL PROTEIN, URINE: 11 mg/dL

## 2018-02-28 NOTE — Discharge Instructions (Signed)
Keep your appointments in the office. Take  of Tylenol by mouth for your headaches every 6-8 hours as needed. Return if you develop worsening headaches, visual changes, worsening edema, or pain in the right upper quadrant. Return if you are in labor, your water has broken or you are having vaginal bleeding.

## 2018-02-28 NOTE — MAU Provider Note (Signed)
History     CSN: [id number]  Arrival date and time: 02/28/18 1126   First Provider Initiated Contact with Patient 02/28/18 1205      Chief Complaint  Patient presents with  . Hypertension   HPI Chelsea Frost 37 y.o. [redacted]w[redacted]d  History of chronic hypertension.  Taking BPs at home and today was 133/87 which is higher than what she usually has.  Is on Labetalol 100 mg BID and is taking it - last was today.  Has had a headache since yesterday and is noticing tingling from below the elbow to her hands bilaterally and from below the knee to her toes bilaterally.  Denies any blurred vision.  Is feeling regular fetal movement.  Denies any vaginal bleeding. Is not feeling any contractions.  She called the office today and was told to come to MAU for lab work.  OB History    Gravida  2   Para  1   Term  1   Preterm  0   AB  0   Living  1     SAB  0   TAB  0   Ectopic  0   Multiple  0   Live Births  1           Past Medical History:  Diagnosis Date  . Anxiety   . Eczema   . GERD (gastroesophageal reflux disease)   . Gestational diabetes    glyburide  . Gestational diabetes mellitus (GDM), antepartum   . Hx of varicella   . Hypertension    labetolol 200 mg bid  . Migraine     Past Surgical History:  Procedure Laterality Date  . BREAST REDUCTION SURGERY    . KNEE SURGERY Left   . WISDOM TOOTH EXTRACTION      Family History  Problem Relation Age of Onset  . Lupus Father        lupus anticoagulant  . Pulmonary embolism Father   . Hiatal hernia Father   . Arrhythmia Father   . Multiple sclerosis Paternal Aunt   . COPD Maternal Grandmother   . Cancer Maternal Grandfather        colon    Social History   Tobacco Use  . Smoking status: Never Smoker  . Smokeless tobacco: Never Used  Substance Use Topics  . Alcohol use: Yes    Comment: Occasional use  . Drug use: No    Allergies:  Allergies  Allergen Reactions  . Imitrex [Sumatriptan] Other  (See Comments)    Jaw issues   . Amoxicillin Rash    Has patient had a PCN reaction causing immediate rash, facial/tongue/throat swelling, SOB or lightheadedness with hypotension: No Has patient had a PCN reaction causing severe rash involving mucus membranes or skin necrosis: No Has patient had a PCN reaction that required hospitalization: No Has patient had a PCN reaction occurring within the last 10 years: Yes If all of the above answers are "NO", then may proceed with Cephalosporin use.     Medications Prior to Admission  Medication Sig Dispense Refill Last Dose  . Acetaminophen-Caffeine (EXCEDRIN TENSION HEADACHE) 500-65 MG TABS Take 2 tablets by mouth daily as needed (For headache.).   Past Month at Unknown time  . aspirin 81 MG chewable tablet Chew 81 mg by mouth at bedtime.   02/27/2018 at Unknown time  . busPIRone (BUSPAR) 5 MG tablet Take 5 mg by mouth 2 (two) times daily.    02/28/2018 at Unknown time  .  butalbital-acetaminophen-caffeine (FIORICET, ESGIC) 50-325-40 MG tablet Take 1 tablet by mouth every 6 (six) hours as needed for headache. 15 tablet 5 Past Month at Unknown time  . citalopram (CELEXA) 20 MG tablet Take 20 mg by mouth at bedtime.   02/27/2018 at Unknown time  . cyclobenzaprine (FLEXERIL) 10 MG tablet Take 10 mg by mouth 3 (three) times daily as needed for muscle spasms.   Past Month at Unknown time  . esomeprazole (NEXIUM) 20 MG capsule Take 20 mg by mouth daily at 12 noon.   02/28/2018 at Unknown time  . folic acid (FOLVITE) 1 MG tablet Take 1 mg by mouth daily.  11 02/27/2018 at Unknown time  . labetalol (NORMODYNE) 100 MG tablet Take 100 mg by mouth 2 (two) times daily.   02/28/2018 at 0930  . levocetirizine (XYZAL) 5 MG tablet Take 5 mg by mouth every evening.   02/27/2018 at Unknown time  . magnesium oxide (MAG-OX) 400 MG tablet Take 400 mg by mouth 2 (two) times daily.   02/27/2018 at Unknown time  . metFORMIN (GLUCOPHAGE) 500 MG tablet Take 500 mg by mouth 2 (two) times  daily with a meal.   02/28/2018 at Unknown time  . Prenatal Vit-Fe Fumarate-FA (PRENATAL MULTIVITAMIN) TABS tablet Take 1 tablet by mouth at bedtime.   02/27/2018 at Unknown time  . riboflavin (VITAMIN B-2) 100 MG TABS tablet Take 100 mg by mouth at bedtime.   02/27/2018 at Unknown time    Review of Systems  Constitutional: Negative for fever.       Elevated BP at home  Gastrointestinal: Negative for abdominal pain, nausea and vomiting.  Genitourinary: Negative for dysuria, vaginal bleeding and vaginal discharge.  Neurological: Positive for headaches.       Tingling in lower arms and hands and in lower legs and feet bilaterally.   Physical Exam   Blood pressure 123/74, pulse 84, temperature 98.9 F (37.2 C), resp. rate 16, height  (1.6 m), weight 197 lb (89.4 kg), unknown if currently breastfeeding.  Physical Exam  Nursing note and vitals reviewed. Constitutional: She is oriented to person, place, and time. She appears well-developed and well-nourished.  HENT:  Head: Normocephalic.  Eyes: EOM are normal.  Neck: Neck supple.  GI: Soft. There is no tenderness. There is no rebound and no guarding.  FHT baseline is 120 with moderate variability and 15x15 accels noted.  No decelerations.  Irregular mild contractions.  Reactive strip.  Musculoskeletal: Normal range of motion.  No edema in ankles Hands have mild swelling  Neurological: She is alert and oriented to person, place, and time.  Skin: Skin is warm and dry.  Psychiatric: She has a normal mood and affect.   Patient Vitals for the past 24 hrs:  BP Temp Pulse Resp Height Weight  02/28/18 1231 123/74 - 84 - - -  02/28/18 1216 124/80 - 85 - - -  02/28/18 1201 129/80 - 91 - - -  02/28/18 1154 129/82 98.9 F (37.2 C) 90 16  (1.6 m) 197 lb (89.4 kg)  02/28/18 1150 129/82 - - - - -    MAU Course  Procedures Results for orders placed or performed during the hospital encounter of 02/28/18 (from the past 24 hour(s))   Urinalysis, Routine w reflex microscopic     Status: Abnormal   Collection Time: 02/28/18 11:40 AM  Result Value Ref Range   Color, Urine YELLOW YELLOW   APPearance CLEAR CLEAR   Specific Gravity, Urine 1.008  1.005 - 1.030   pH 7.0 5.0 - 8.0   Glucose, UA NEGATIVE NEGATIVE mg/dL   Hgb urine dipstick NEGATIVE NEGATIVE   Bilirubin Urine NEGATIVE NEGATIVE   Ketones, ur NEGATIVE NEGATIVE mg/dL   Protein, ur NEGATIVE NEGATIVE mg/dL   Nitrite NEGATIVE NEGATIVE   Leukocytes, UA LARGE (A) NEGATIVE   RBC / HPF 0-5 0 - 5 RBC/hpf   WBC, UA 0-5 0 - 5 WBC/hpf   Bacteria, UA RARE (A) NONE SEEN   Squamous Epithelial / LPF 11-20 0 - 5  Protein / creatinine ratio, urine     Status: Abnormal   Collection Time: 02/28/18 11:40 AM  Result Value Ref Range   Creatinine, Urine 58.00 mg/dL   Total Protein, Urine 11 mg/dL   Protein Creatinine Ratio 0.19 (H) 0.00 - 0.15 mg/mg[Cre]  CBC     Status: Abnormal   Collection Time: 02/28/18 12:12 PM  Result Value Ref Range   WBC 8.6 4.0 - 10.5 K/uL   RBC 3.34 (L) 3.87 - 5.11 MIL/uL   Hemoglobin 9.8 (L) 12.0 - 15.0 g/dL   HCT 29.5 (L) 62.1 - 30.8 %   MCV 85.9 78.0 - 100.0 fL   MCH 29.3 26.0 - 34.0 pg   MCHC 34.1 30.0 - 36.0 g/dL   RDW 65.7 84.6 - 96.2 %   Platelets 238 150 - 400 K/uL  Comprehensive metabolic panel     Status: Abnormal   Collection Time: 02/28/18 12:12 PM  Result Value Ref Range   Sodium 135 135 - 145 mmol/L   Potassium 3.9 3.5 - 5.1 mmol/L   Chloride 107 101 - 111 mmol/L   CO2 19 (L) 22 - 32 mmol/L   Glucose, Bld 94 65 - 99 mg/dL   BUN 6 6 - 20 mg/dL   Creatinine, Ser 9.52 0.44 - 1.00 mg/dL   Calcium 8.8 (L) 8.9 - 10.3 mg/dL   Total Protein 6.8 6.5 - 8.1 g/dL   Albumin 3.0 (L) 3.5 - 5.0 g/dL   AST 14 (L) 15 - 41 U/L   ALT 9 (L) 14 - 54 U/L   Alkaline Phosphatase 119 38 - 126 U/L   Total Bilirubin 0.2 (L) 0.3 - 1.2 mg/dL   GFR calc non Af Amer >60 >60 mL/min   GFR calc Af Amer >60 >60 mL/min   Anion gap 9 5 - 15     MDM Consult with Dr. Ernestina Penna and reviewed the plan of care.  No lab elevations and BPs in hospital have been normal.  No preeclampsia found today.  Assessment and Plan  Chronic hypertension at [redacted]w[redacted]d with headaches and normal labs today Reactive NST  Plan Keep your appointments in the office  Take Tylenol 1000 mg by mouth every 6 hours if needed for headache Return for worsening headache, visual changes, RUQ pain or worsening swelling. Return for labor if you are having contractions, vaginal bleeding or rupture of membranes.  Chelsea Frost 02/28/2018, 2:28 PM

## 2018-02-28 NOTE — MAU Note (Signed)
Pt presents to MAU with complaints of increase in blood pressure. States she called office and was told to come in. Pt has a history of CHTN and is currently taking labetalol  BID. Reports mild headache, reports the headaches are also chronic. Denies any blurred vision or changes in vision

## 2018-03-01 ENCOUNTER — Other Ambulatory Visit: Payer: Self-pay | Admitting: Neurology

## 2018-03-09 ENCOUNTER — Encounter (HOSPITAL_COMMUNITY): Payer: Self-pay | Admitting: *Deleted

## 2018-03-09 ENCOUNTER — Telehealth (HOSPITAL_COMMUNITY): Payer: Self-pay | Admitting: *Deleted

## 2018-03-09 NOTE — Telephone Encounter (Signed)
Preadmission screen  

## 2018-03-10 ENCOUNTER — Inpatient Hospital Stay (HOSPITAL_COMMUNITY)
Admission: AD | Admit: 2018-03-10 | Discharge: 2018-03-12 | DRG: 807 | Disposition: A | Discharge status: Home / Self Care | Payer: Managed Care, Other (non HMO) | Source: Ambulatory Visit | Attending: Obstetrics and Gynecology | Admitting: Obstetrics and Gynecology

## 2018-03-10 ENCOUNTER — Other Ambulatory Visit: Payer: Self-pay

## 2018-03-10 ENCOUNTER — Encounter (HOSPITAL_COMMUNITY): Payer: Self-pay | Admitting: *Deleted

## 2018-03-10 ENCOUNTER — Inpatient Hospital Stay (HOSPITAL_COMMUNITY): Admission: RE | Admit: 2018-03-10 | Payer: Managed Care, Other (non HMO) | Source: Ambulatory Visit

## 2018-03-10 ENCOUNTER — Other Ambulatory Visit: Payer: Self-pay | Admitting: Obstetrics and Gynecology

## 2018-03-10 DIAGNOSIS — Z3490 Encounter for supervision of normal pregnancy, unspecified, unspecified trimester: Secondary | ICD-10-CM | POA: Diagnosis present

## 2018-03-10 DIAGNOSIS — O9902 Anemia complicating childbirth: Secondary | ICD-10-CM | POA: Diagnosis present

## 2018-03-10 DIAGNOSIS — F418 Other specified anxiety disorders: Secondary | ICD-10-CM | POA: Diagnosis present

## 2018-03-10 DIAGNOSIS — D649 Anemia, unspecified: Secondary | ICD-10-CM | POA: Diagnosis present

## 2018-03-10 DIAGNOSIS — O99344 Other mental disorders complicating childbirth: Secondary | ICD-10-CM | POA: Diagnosis present

## 2018-03-10 DIAGNOSIS — Z3A37 37 weeks gestation of pregnancy: Secondary | ICD-10-CM

## 2018-03-10 DIAGNOSIS — Z880 Allergy status to penicillin: Secondary | ICD-10-CM

## 2018-03-10 DIAGNOSIS — O24419 Gestational diabetes mellitus in pregnancy, unspecified control: Secondary | ICD-10-CM

## 2018-03-10 DIAGNOSIS — O24425 Gestational diabetes mellitus in childbirth, controlled by oral hypoglycemic drugs: Secondary | ICD-10-CM | POA: Diagnosis present

## 2018-03-10 DIAGNOSIS — O1002 Pre-existing essential hypertension complicating childbirth: Principal | ICD-10-CM | POA: Diagnosis present

## 2018-03-10 DIAGNOSIS — O10919 Unspecified pre-existing hypertension complicating pregnancy, unspecified trimester: Secondary | ICD-10-CM | POA: Diagnosis present

## 2018-03-10 DIAGNOSIS — F419 Anxiety disorder, unspecified: Secondary | ICD-10-CM | POA: Diagnosis present

## 2018-03-10 LAB — CBC
HEMATOCRIT: 30.4 % — AB (ref 36.0–46.0)
HEMOGLOBIN: 10.2 g/dL — AB (ref 12.0–15.0)
MCH: 29.5 pg (ref 26.0–34.0)
MCHC: 33.6 g/dL (ref 30.0–36.0)
MCV: 87.9 fL (ref 78.0–100.0)
Platelets: 247 10*3/uL (ref 150–400)
RBC: 3.46 MIL/uL — ABNORMAL LOW (ref 3.87–5.11)
RDW: 14.3 % (ref 11.5–15.5)
WBC: 9.2 10*3/uL (ref 4.0–10.5)

## 2018-03-10 MED ORDER — OXYTOCIN 40 UNITS IN LACTATED RINGERS INFUSION - SIMPLE MED
1.0000 m[IU]/min | INTRAVENOUS | Status: DC
  Administered 2018-03-11: 14 m[IU]/min via INTRAVENOUS
  Administered 2018-03-11: 6 m[IU]/min via INTRAVENOUS
  Administered 2018-03-11: 12 m[IU]/min via INTRAVENOUS
  Administered 2018-03-11: 4 m[IU]/min via INTRAVENOUS
  Administered 2018-03-11: 39960 m[IU]/h via INTRAVENOUS
  Administered 2018-03-11: 8 m[IU]/min via INTRAVENOUS
  Administered 2018-03-11: 2 m[IU]/min via INTRAVENOUS
  Administered 2018-03-11: 10 m[IU]/min via INTRAVENOUS
  Filled 2018-03-10: qty 1000

## 2018-03-10 MED ORDER — LACTATED RINGERS IV SOLN
INTRAVENOUS | Status: DC
  Administered 2018-03-10 – 2018-03-11 (×3): via INTRAVENOUS

## 2018-03-10 MED ORDER — TERBUTALINE SULFATE 1 MG/ML IJ SOLN
0.2500 mg | Freq: Once | INTRAMUSCULAR | Status: DC | PRN
  Filled 2018-03-10: qty 1

## 2018-03-10 MED ORDER — SOD CITRATE-CITRIC ACID 500-334 MG/5ML PO SOLN: 30.0000 mL | ORAL | Status: DC | PRN

## 2018-03-10 MED ORDER — ONDANSETRON HCL 4 MG/2ML IJ SOLN: 4.0000 mg | Freq: Four times a day (QID) | INTRAMUSCULAR | Status: DC | PRN

## 2018-03-10 MED ORDER — ACETAMINOPHEN 325 MG PO TABS: 650.0000 mg | ORAL_TABLET | ORAL | Status: DC | PRN

## 2018-03-10 MED ORDER — LACTATED RINGERS IV SOLN: 500.0000 mL | INTRAVENOUS | Status: DC | PRN

## 2018-03-10 MED ORDER — MISOPROSTOL 25 MCG QUARTER TABLET: 25.0000 ug | ORAL_TABLET | ORAL | Status: DC | PRN

## 2018-03-11 ENCOUNTER — Encounter (HOSPITAL_COMMUNITY): Payer: Self-pay

## 2018-03-11 ENCOUNTER — Inpatient Hospital Stay (HOSPITAL_COMMUNITY): Payer: Managed Care, Other (non HMO) | Admitting: Anesthesiology

## 2018-03-11 LAB — TYPE AND SCREEN
ABO/RH(D): A POS
Antibody Screen: NEGATIVE

## 2018-03-11 LAB — COMPREHENSIVE METABOLIC PANEL
ALK PHOS: 151 U/L — AB (ref 38–126)
ALT: 9 U/L — AB (ref 14–54)
AST: 17 U/L (ref 15–41)
Albumin: 3.1 g/dL — ABNORMAL LOW (ref 3.5–5.0)
Anion gap: 9 (ref 5–15)
BUN: 9 mg/dL (ref 6–20)
CALCIUM: 9.3 mg/dL (ref 8.9–10.3)
CO2: 21 mmol/L — ABNORMAL LOW (ref 22–32)
CREATININE: 0.58 mg/dL (ref 0.44–1.00)
Chloride: 106 mmol/L (ref 101–111)
Glucose, Bld: 99 mg/dL (ref 65–99)
Potassium: 4.3 mmol/L (ref 3.5–5.1)
Sodium: 136 mmol/L (ref 135–145)
Total Bilirubin: 0.3 mg/dL (ref 0.3–1.2)
Total Protein: 6.6 g/dL (ref 6.5–8.1)

## 2018-03-11 LAB — RPR: RPR: NONREACTIVE

## 2018-03-11 LAB — GLUCOSE, CAPILLARY
GLUCOSE-CAPILLARY: 103 mg/dL — AB (ref 65–99)
GLUCOSE-CAPILLARY: 119 mg/dL — AB (ref 65–99)
Glucose-Capillary: 96 mg/dL (ref 65–99)

## 2018-03-11 LAB — LACTATE DEHYDROGENASE: LDH: 131 U/L (ref 98–192)

## 2018-03-11 LAB — URIC ACID: URIC ACID, SERUM: 4.3 mg/dL (ref 2.3–6.6)

## 2018-03-11 MED ORDER — BUSPIRONE HCL 5 MG PO TABS
5.0000 mg | ORAL_TABLET | Freq: Two times a day (BID) | ORAL | Status: DC
  Administered 2018-03-11 – 2018-03-12 (×2): 5 mg via ORAL
  Filled 2018-03-11 (×4): qty 1

## 2018-03-11 MED ORDER — PRENATAL MULTIVITAMIN CH
1.0000 | ORAL_TABLET | Freq: Every day | ORAL | Status: DC
  Administered 2018-03-12: 1 via ORAL
  Filled 2018-03-11: qty 1

## 2018-03-11 MED ORDER — ONDANSETRON HCL 4 MG/2ML IJ SOLN: 4.0000 mg | INTRAMUSCULAR | Status: DC | PRN

## 2018-03-11 MED ORDER — METHYLERGONOVINE MALEATE 0.2 MG/ML IJ SOLN: 0.2000 mg | INTRAMUSCULAR | Status: DC | PRN

## 2018-03-11 MED ORDER — ONDANSETRON HCL 4 MG PO TABS: 4.0000 mg | ORAL_TABLET | ORAL | Status: DC | PRN

## 2018-03-11 MED ORDER — TETANUS-DIPHTH-ACELL PERTUSSIS 5-2.5-18.5 LF-MCG/0.5 IM SUSP: 0.5000 mL | Freq: Once | INTRAMUSCULAR | Status: DC

## 2018-03-11 MED ORDER — IBUPROFEN 600 MG PO TABS
600.0000 mg | ORAL_TABLET | Freq: Four times a day (QID) | ORAL | Status: DC
  Administered 2018-03-11 – 2018-03-12 (×4): 600 mg via ORAL
  Filled 2018-03-11 (×4): qty 1

## 2018-03-11 MED ORDER — BENZOCAINE-MENTHOL 20-0.5 % EX AERO
1.0000 "application " | INHALATION_SPRAY | CUTANEOUS | Status: DC | PRN
  Administered 2018-03-11: 1 via TOPICAL
  Filled 2018-03-11: qty 56

## 2018-03-11 MED ORDER — METHYLERGONOVINE MALEATE 0.2 MG PO TABS: 0.2000 mg | ORAL_TABLET | ORAL | Status: DC | PRN

## 2018-03-11 MED ORDER — DIPHENHYDRAMINE HCL 25 MG PO CAPS: 25.0000 mg | ORAL_CAPSULE | Freq: Four times a day (QID) | ORAL | Status: DC | PRN

## 2018-03-11 MED ORDER — OXYCODONE-ACETAMINOPHEN 5-325 MG PO TABS: 1.0000 | ORAL_TABLET | ORAL | Status: DC | PRN

## 2018-03-11 MED ORDER — ZOLPIDEM TARTRATE 5 MG PO TABS
5.0000 mg | ORAL_TABLET | Freq: Every evening | ORAL | Status: DC | PRN
Start: 2018-03-11 — End: 2018-03-12

## 2018-03-11 MED ORDER — DIPHENHYDRAMINE HCL 50 MG/ML IJ SOLN: 12.5000 mg | INTRAMUSCULAR | Status: DC | PRN

## 2018-03-11 MED ORDER — OXYTOCIN 40 UNITS IN LACTATED RINGERS INFUSION - SIMPLE MED
2.5000 [IU]/h | INTRAVENOUS | Status: DC
  Administered 2018-03-11: 2.5 [IU]/h via INTRAVENOUS

## 2018-03-11 MED ORDER — FENTANYL 2.5 MCG/ML BUPIVACAINE 1/10 % EPIDURAL INFUSION (WH - ANES)
14.0000 mL/h | INTRAMUSCULAR | Status: DC | PRN
  Administered 2018-03-11: 14 mL/h via EPIDURAL
  Filled 2018-03-11: qty 100

## 2018-03-11 MED ORDER — LACTATED RINGERS IV SOLN: 500.0000 mL | Freq: Once | INTRAVENOUS | Status: DC

## 2018-03-11 MED ORDER — WITCH HAZEL-GLYCERIN EX PADS: 1.0000 "application " | MEDICATED_PAD | CUTANEOUS | Status: DC | PRN

## 2018-03-11 MED ORDER — BUTALBITAL-APAP-CAFFEINE 50-325-40 MG PO TABS
1.0000 | ORAL_TABLET | Freq: Once | ORAL | Status: AC
  Administered 2018-03-11: 1 via ORAL
  Filled 2018-03-11: qty 1

## 2018-03-11 MED ORDER — MISOPROSTOL 50MCG HALF TABLET
50.0000 ug | ORAL_TABLET | Freq: Once | ORAL | Status: AC
  Administered 2018-03-11: 50 ug via ORAL
  Filled 2018-03-11: qty 1

## 2018-03-11 MED ORDER — LIDOCAINE HCL (PF) 1 % IJ SOLN
INTRAMUSCULAR | Status: DC | PRN
  Administered 2018-03-11 (×2): 5 mL via EPIDURAL

## 2018-03-11 MED ORDER — ACETAMINOPHEN 325 MG PO TABS: 650.0000 mg | ORAL_TABLET | ORAL | Status: DC | PRN

## 2018-03-11 MED ORDER — SENNOSIDES-DOCUSATE SODIUM 8.6-50 MG PO TABS
2.0000 | ORAL_TABLET | ORAL | Status: DC
  Administered 2018-03-12: 2 via ORAL
  Filled 2018-03-11: qty 2

## 2018-03-11 MED ORDER — PHENYLEPHRINE 40 MCG/ML (10ML) SYRINGE FOR IV PUSH (FOR BLOOD PRESSURE SUPPORT)
80.0000 ug | PREFILLED_SYRINGE | INTRAVENOUS | Status: DC | PRN
  Filled 2018-03-11: qty 5

## 2018-03-11 MED ORDER — LABETALOL HCL 100 MG PO TABS
100.0000 mg | ORAL_TABLET | Freq: Two times a day (BID) | ORAL | Status: DC
  Administered 2018-03-11 – 2018-03-12 (×2): 100 mg via ORAL
  Filled 2018-03-11 (×2): qty 1

## 2018-03-11 MED ORDER — CITALOPRAM HYDROBROMIDE 20 MG PO TABS
20.0000 mg | ORAL_TABLET | Freq: Every day | ORAL | Status: DC
  Administered 2018-03-11: 20 mg via ORAL
  Filled 2018-03-11 (×2): qty 1

## 2018-03-11 MED ORDER — LACTATED RINGERS IV SOLN
500.0000 mL | Freq: Once | INTRAVENOUS | Status: AC
  Administered 2018-03-11: 500 mL via INTRAVENOUS

## 2018-03-11 MED ORDER — SIMETHICONE 80 MG PO CHEW: 80.0000 mg | CHEWABLE_TABLET | ORAL | Status: DC | PRN

## 2018-03-11 MED ORDER — COCONUT OIL OIL: 1.0000 "application " | TOPICAL_OIL | Status: DC | PRN

## 2018-03-11 MED ORDER — EPHEDRINE 5 MG/ML INJ
10.0000 mg | INTRAVENOUS | Status: DC | PRN
  Filled 2018-03-11: qty 2

## 2018-03-11 MED ORDER — DIBUCAINE 1 % RE OINT: 1.0000 "application " | TOPICAL_OINTMENT | RECTAL | Status: DC | PRN

## 2018-03-11 MED ORDER — PHENYLEPHRINE 40 MCG/ML (10ML) SYRINGE FOR IV PUSH (FOR BLOOD PRESSURE SUPPORT)
80.0000 ug | PREFILLED_SYRINGE | INTRAVENOUS | Status: DC | PRN
  Filled 2018-03-11: qty 10
  Filled 2018-03-11: qty 5

## 2018-03-11 MED ORDER — OXYCODONE-ACETAMINOPHEN 5-325 MG PO TABS: 2.0000 | ORAL_TABLET | ORAL | Status: DC | PRN

## 2018-03-11 NOTE — Anesthesia Procedure Notes (Signed)
Epidural Patient location during procedure: OB Start time: 03/11/2018 9:38 AM End time: 03/11/2018 9:48 AM  Staffing Anesthesiologist: Achille Rich, MD Performed: anesthesiologist   Preanesthetic Checklist Completed: patient identified, site marked, pre-op evaluation, timeout performed, IV checked, risks and benefits discussed and monitors and equipment checked  Epidural Patient position: sitting Prep: DuraPrep Patient monitoring: heart rate, cardiac monitor, continuous pulse ox and blood pressure Approach: midline Location: L2-L3 Injection technique: LOR saline  Needle:  Needle type: Tuohy  Needle gauge: 17 G Needle length: 9 cm Needle insertion depth: 6 cm Catheter type: closed end flexible Catheter size: 19 Gauge Catheter at skin depth: 12 cm Test dose: negative and Other  Assessment Events: blood not aspirated, injection not painful, no injection resistance and negative IV test  Additional Notes Informed consent obtained prior to proceeding including risk of failure, 1% risk of PDPH, risk of minor discomfort and bruising.  Discussed rare but serious complications including epidural abscess, permanent nerve injury, epidural hematoma.  Discussed alternatives to epidural analgesia and patient desires to proceed.  Timeout performed pre-procedure verifying patient name, procedure, and platelet count.  Patient tolerated procedure well. Reason for block:procedure for pain

## 2018-03-11 NOTE — Anesthesia Preprocedure Evaluation (Signed)
Anesthesia Evaluation  Patient identified by MRN, date of birth, ID band Patient awake    Reviewed: Allergy & Precautions, H&P , NPO status , Patient's Chart, lab work & pertinent test results  Airway Mallampati: II   Neck ROM: full    Dental   Pulmonary    breath sounds clear to auscultation       Cardiovascular hypertension,  Rhythm:regular Rate:Normal     Neuro/Psych  Headaches, PSYCHIATRIC DISORDERS Anxiety    GI/Hepatic GERD  ,  Endo/Other  diabetes  Renal/GU      Musculoskeletal   Abdominal   Peds  Hematology  (+) anemia ,   Anesthesia Other Findings   Reproductive/Obstetrics (+) Pregnancy                             Anesthesia Physical Anesthesia Plan  ASA: II  Anesthesia Plan: Epidural   Post-op Pain Management:  Regional for Post-op pain   Induction: Intravenous  PONV Risk Score and Plan: 2 and Treatment may vary due to age or medical condition  Airway Management Planned: Natural Airway  Additional Equipment:   Intra-op Plan:   Post-operative Plan:   Informed Consent: I have reviewed the patients History and Physical, chart, labs and discussed the procedure including the risks, benefits and alternatives for the proposed anesthesia with the patient or authorized representative who has indicated his/her understanding and acceptance.     Plan Discussed with: Anesthesiologist  Anesthesia Plan Comments:         Anesthesia Quick Evaluation

## 2018-03-11 NOTE — Anesthesia Pain Management Evaluation Note (Addendum)
  CRNA Pain Management Visit Note  Patient: Chelsea Frost, 37 y.o., female  "Hello I am a member of the anesthesia team at [facility]. We have an anesthesia team available at all times to provide care throughout the hospital, including epidural management and anesthesia for C-section. I don't know your plan for the delivery whether it a natural birth, water birth, IV sedation, nitrous supplementation, doula or epidural, but we want to meet your pain goals."   1.Was your pain managed to your expectations on prior hospitalizations?   Yes   2.What is your expectation for pain management during this hospitalization?     Epidural  3.How can we help you reach that goal? Be available for epidural placement when desired   Record the patient's initial score and the patient's pain goal.   Pain: 2  Pain Goal: 7 [facility] wants you to be able to say your pain was always managed very well.  [name] 03/11/2018

## 2018-03-11 NOTE — H&P (Signed)
Chelsea Frost is a 37 y.o. female presenting for IOL for CHTN with exacerbation and class A2DM. OB History    Gravida  2   Para  1   Term  1   Preterm  0   AB  0   Living  1     SAB  0   TAB  0   Ectopic  0   Multiple  0   Live Births  1          Past Medical History:  Diagnosis Date  . Anxiety   . Eczema   . GERD (gastroesophageal reflux disease)   . Gestational diabetes    glyburide  . Gestational diabetes mellitus (GDM), antepartum   . Hx of varicella   . Hypertension    labetolol 200 mg bid  . Migraine    Past Surgical History:  Procedure Laterality Date  . BREAST REDUCTION SURGERY    . KNEE SURGERY Left   . WISDOM TOOTH EXTRACTION     Family History: family history includes ADD / ADHD in her sister; Arrhythmia in her father; COPD in her maternal grandmother; Cancer in her maternal grandfather; Deep vein thrombosis in her father; GER disease in her father; Hiatal hernia in her father; Hypertension in her mother; Lupus in her father and paternal aunt; Migraines in her father; Multiple sclerosis in her paternal aunt; Pulmonary embolism in her father. Social History:  reports that she has never smoked. She has never used smokeless tobacco. She reports that she drinks alcohol. She reports that she does not use drugs.     Maternal Diabetes: Yes:  Diabetes Type:  Insulin/Medication controlled Genetic Screening: Normal Maternal Ultrasounds/Referrals: Normal Fetal Ultrasounds or other Referrals:  None Maternal Substance Abuse:  No Significant Maternal Medications:  None Significant Maternal Lab Results:  None Other Comments:  None  Review of Systems  Constitutional: Negative.   Neurological: Positive for headaches.  All other systems reviewed and are negative.  Maternal Medical History:  Contractions: Onset was less than 1 hour ago.   Frequency: rare.    Fetal activity: Perceived fetal activity is normal.   Last perceived fetal movement was  within the past hour.    Prenatal complications: PIH.   Prenatal Complications - Diabetes: gestational. Diabetes is managed by diet and oral agent (monotherapy).      Dilation: 2 Effacement (%): 50 Station: -2 Exam by:: k hutchison, rn Blood pressure 115/69, pulse 86, temperature 98.8 F (37.1 C), temperature source Oral, resp. rate 16, height 5' 2.75" (1.594 m), weight 89.7 kg (197 lb 11.2 oz), unknown if currently breastfeeding. Maternal Exam:  Uterine Assessment: Contraction strength is mild.  Contraction frequency is rare.   Abdomen: Patient reports no abdominal tenderness. Fetal presentation: vertex  Introitus: Normal vulva. Normal vagina.  Ferning test: not done.  Nitrazine test: not done. Amniotic fluid character: not assessed.  Pelvis: adequate for delivery.   Cervix: Cervix evaluated by digital exam.     Physical Exam  Nursing note and vitals reviewed. Constitutional: She is oriented to person, place, and time. She appears well-developed and well-nourished.  HENT:  Head: Normocephalic and atraumatic.  Neck: Normal range of motion. Neck supple.  Cardiovascular: Normal rate and regular rhythm.  Respiratory: Effort normal and breath sounds normal.  GI: Bowel sounds are normal.  Genitourinary: Vagina normal and uterus normal.  Musculoskeletal: Normal range of motion.  Neurological: She is alert and oriented to person, place, and time. She has normal reflexes.  Skin: Skin is warm and dry.  Psychiatric: She has a normal mood and affect.    Prenatal labs: ABO, Rh: --/--/A POS (05/16 2333) Antibody: NEG (05/16 2333) Rubella: Immune (10/30 0000) RPR: Nonreactive (10/30 0000)  HBsAg: Negative (10/30 0000)  HIV: Non-reactive (10/30 0000)  GBS: Negative (05/01 0000)   Assessment/Plan: 37+ weeks Class A2DM on Metformin CHTN on Labetalol with exacerbation IOL Ck labs BS q 4   Chelsea Frost J 03/11/2018, 6:42 AM

## 2018-03-12 DIAGNOSIS — O9902 Anemia complicating childbirth: Secondary | ICD-10-CM | POA: Diagnosis present

## 2018-03-12 DIAGNOSIS — F418 Other specified anxiety disorders: Secondary | ICD-10-CM | POA: Diagnosis present

## 2018-03-12 DIAGNOSIS — O24419 Gestational diabetes mellitus in pregnancy, unspecified control: Secondary | ICD-10-CM

## 2018-03-12 DIAGNOSIS — O10919 Unspecified pre-existing hypertension complicating pregnancy, unspecified trimester: Secondary | ICD-10-CM | POA: Diagnosis present

## 2018-03-12 LAB — CBC
HEMATOCRIT: 28.9 % — AB (ref 36.0–46.0)
Hemoglobin: 9.6 g/dL — ABNORMAL LOW (ref 12.0–15.0)
MCH: 29.4 pg (ref 26.0–34.0)
MCHC: 33.2 g/dL (ref 30.0–36.0)
MCV: 88.7 fL (ref 78.0–100.0)
Platelets: 209 10*3/uL (ref 150–400)
RBC: 3.26 MIL/uL — ABNORMAL LOW (ref 3.87–5.11)
RDW: 14.6 % (ref 11.5–15.5)
WBC: 7.4 10*3/uL (ref 4.0–10.5)

## 2018-03-12 MED ORDER — IBUPROFEN 600 MG PO TABS: 600.0000 mg | ORAL_TABLET | Freq: Four times a day (QID) | ORAL | 0 refills | Status: DC

## 2018-03-12 MED ORDER — COCONUT OIL OIL: 1.0000 "application " | TOPICAL_OIL | 0 refills | Status: DC | PRN

## 2018-03-12 MED ORDER — MAGNESIUM OXIDE 400 (241.3 MG) MG PO TABS
400.0000 mg | ORAL_TABLET | Freq: Every day | ORAL | Status: DC
  Administered 2018-03-12: 400 mg via ORAL
  Filled 2018-03-12 (×2): qty 1

## 2018-03-12 MED ORDER — POLYSACCHARIDE IRON COMPLEX 150 MG PO CAPS
150.0000 mg | ORAL_CAPSULE | Freq: Every day | ORAL | Status: DC
  Filled 2018-03-12: qty 1

## 2018-03-12 NOTE — Lactation Note (Signed)
This note was copied from a baby's chart. Lactation Consultation Note  Patient Name: Chelsea Frost [id number] Date: 03/12/2018 Reason for consult: Initial assessment;Early term 37-38.6wks;Breast reduction  Feeding preference for this mom prior to delivery  Chelsea Frost ( Nurse midwife ) requested with this LC to see this mom and discuss  Latching at the breast with SNS.  Baby is 22 hours.  As LC entered room, dad holding baby and mom sitting up in bed.  LC explained to mom why she was coming to see her and that her midwife Requested to see her.  LC explored feeding options with mom /  Per mom aware due to her reduction the need to supplement.  With her 1st baby she just pumped , the breast breast never produced much  So she let it dry up and the 2nd breast she pumped for 4-5 months and the most she  Ever was able to express was 300 ml a day, eventually had to stop due to challenges  With her work.   LC  Reviewed supply and demand, and offered to set up a DEBP for post pumping after feedings to  For stimulation. Mom responded, I didn't care for the hospital pump, and did better with my DEBP Spectra.  LC discussed latching with SNS at the breast for extra stimulation  To enhance let down.  Per mom willing to try it, but of it didn't work it would be ok and she would feed formula in a bottle.  Baby awake, dad changed a stool diaper,  STS and LC assisted mom to hand express 1st ,noted some crusting on her right nipple and areolas to have edema, semi compressible. LC reviewed hand expressing and several drops noted.  Baby latched on and off for approx 8 mins, took 5.5 ml of formula from 40F SNS and would not re-latch.  LC instructed mom on the use of Shells between feedings except when sleeping and mom planned to put them on after shower.  Due the areola edema, SHELLS are indicated to enhance the compressibility of the areola for a deeper latch.  Feeding finished with a bottle for calories.   MBURN Chelsea Frost aware of the results of the consult.  LC also showed dad how he could help.    Maternal Data Has patient been taught Hand Expression?: Yes Does the patient have breastfeeding experience prior to this delivery?: Yes  Feeding Feeding Type: Breast Milk with Formula added Nipple Type: Slow - flow Length of feed: (on and off 8 , unable to sustain latch )  LATCH Score Latch: Repeated attempts needed to sustain latch, nipple held in mouth throughout feeding, stimulation needed to elicit sucking reflex.  Audible Swallowing: Spontaneous and intermittent(with SNS )  Type of Nipple: Everted at rest and after stimulation(swollne areolas , semi compressible areolas )  Comfort (Breast/Nipple): Soft / non-tender  Hold (Positioning): Full assist, staff holds infant at breast  LATCH Score: 7  Interventions Interventions: Breast feeding basics reviewed;Assisted with latch;Skin to skin;Breast massage;Hand express;Breast compression;Adjust position;Support pillows;Position options;Shells  Lactation Tools Discussed/Used Tools: Shells(mom declined pump , will try the SNS ) Shell Type: Inverted WIC Program: No   Consult Status Consult Status: Follow-up Date: 03/13/18 Follow-up type: In-patient    Chelsea Frost Chelsea Frost 03/12/2018, 12:47 PM

## 2018-03-12 NOTE — Discharge Summary (Signed)
Obstetric Discharge Summary Reason for Admission: induction of labor and chronic hypertension, A2 gestational diabetes Prenatal Procedures: NST and ultrasound Intrapartum Procedures: spontaneous vaginal delivery and epidural Postpartum Procedures: none Complications-Operative and Postpartum: none Hemoglobin  Date Value Ref Range Status  03/12/2018 9.6 (L) 12.0 - 15.0 g/dL Final   HCT  Date Value Ref Range Status  03/12/2018 28.9 (L) 36.0 - 46.0 % Final    Physical Exam:  General: alert, cooperative and no distress Lochia: appropriate Uterine Fundus: firm Incision: na DVT Evaluation: No significant calf/ankle edema.  Discharge Diagnoses: Term Pregnancy-delivered and anemia  Discharge Information: Date: 03/12/2018 Activity: pelvic rest Diet: routine Medications:  Allergies as of 03/12/2018      Reactions   Imitrex [sumatriptan] Other (See Comments)   Jaw issues    Amoxicillin Rash   Has patient had a PCN reaction causing immediate rash, facial/tongue/throat swelling, SOB or lightheadedness with hypotension: No Has patient had a PCN reaction causing severe rash involving mucus membranes or skin necrosis: No Has patient had a PCN reaction that required hospitalization: No Has patient had a PCN reaction occurring within the last 10 years: Yes If all of the above answers are "NO", then may proceed with Cephalosporin use.      Medication List    STOP taking these medications   aspirin 81 MG chewable tablet   metFORMIN 500 MG tablet Commonly known as:  GLUCOPHAGE     TAKE these medications   acetaminophen 500 MG tablet Commonly known as:  TYLENOL Take 1,000 mg by mouth every 6 (six) hours as needed for moderate pain.   busPIRone 5 MG tablet Commonly known as:  BUSPAR Take 5 mg by mouth 2 (two) times daily.   butalbital-acetaminophen-caffeine 50-325-40 MG tablet Commonly known as:  FIORICET, ESGIC TAKE 1 TABLET BY MOUTH EVERY 6 HOURS AS NEEDED FOR HEADACHE    citalopram 20 MG tablet Commonly known as:  CELEXA Take 20 mg by mouth at bedtime.   coconut oil Oil Apply 1 application topically as needed.   cyclobenzaprine 10 MG tablet Commonly known as:  FLEXERIL Take 10 mg by mouth 3 (three) times daily as needed for muscle spasms.   esomeprazole 20 MG capsule Commonly known as:  NEXIUM Take 20 mg by mouth daily at 12 noon.   EXCEDRIN TENSION HEADACHE 500-65 MG Tabs Generic drug:  Acetaminophen-Caffeine Take 2 tablets by mouth daily as needed (For headache.).   folic acid 1 MG tablet Commonly known as:  FOLVITE Take 1 mg by mouth daily.   ibuprofen 600 MG tablet Commonly known as:  ADVIL,MOTRIN Take 1 tablet (600 mg total) by mouth every 6 (six) hours.   IRON PO Take 1 tablet by mouth every other day.   labetalol 100 MG tablet Commonly known as:  NORMODYNE Take 100 mg by mouth 2 (two) times daily.   levocetirizine 5 MG tablet Commonly known as:  XYZAL Take 5 mg by mouth every evening.   magnesium oxide 400 MG tablet Commonly known as:  MAG-OX Take 400 mg by mouth 2 (two) times daily.   prenatal multivitamin Tabs tablet Take 1 tablet by mouth at bedtime.   riboflavin 100 MG Tabs tablet Commonly known as:  VITAMIN B-2 Take 100 mg by mouth at bedtime.            Discharge Care Instructions  (From admission, onward)        Start     Ordered   03/12/18 0000  Discharge wound care:  Comments:  Sitz baths 2 times /day with warm water x 1 week   03/12/18 0950     Condition: stable Instructions: refer to practice specific booklet Discharge to: home Follow-up Information    Olivia Mackie, MD. Schedule an appointment as soon as possible for a visit in 6 week(s).   Specialty:  Obstetrics and Gynecology Contact information: [address] [place] [address] [phone]           Newborn Data: Live born female Estella Husk Weight: 6 lb 13.9 oz (3115 g) APGAR: 8, 9  Newborn Delivery   Birth  date/time:  03/11/2018 14:35:00 Delivery type:  Vaginal, Spontaneous     Home with mother.  Neta Mends, CNM 03/12/2018, 9:50 AM

## 2018-03-12 NOTE — Plan of Care (Signed)
Pt. Condition will continue to improve 

## 2018-03-12 NOTE — Progress Notes (Signed)
PPD # 1 SVD Information for the patient's newborn:  Robertta, [place] Girl K[place]i [[id number]]  female      Formula feeding, hx breast redux. Was able to partially breastfeed her son, discussed starting breastfeeding and using S&S to supplement.    Baby name: Ulyess Blossom:  Reports feeling well, desires early DC.             Tolerating po/ No nausea or vomiting             Bleeding is light             Pain controlled with ibuprofen (OTC)             Up ad lib / ambulatory / voiding without difficulties        O:  A & O x 3, in no apparent distress              VS:  Vitals:   03/11/18 1713 03/11/18 1842 03/11/18 2126 03/12/18 0600  BP: 126/74 131/79 136/84 (!) 98/55  Pulse: 80 78 73 (!) 50  Resp: Temp: 99.3 F (37.4 C) 99.1 F (37.3 C) 99 F (37.2 C) 98 F (36.7 C)  TempSrc: Oral Oral Oral   SpO2: 100%     Weight:      Height:        LABS:  Recent Labs    03/10/18 2333 03/12/18 0559  WBC 9.2 7.4  HGB 10.2* 9.6*  HCT 30.4* 28.9*  PLT 247 209    Blood type: --/--/A POS (05/16 2333)  Rubella: Immune (10/30 0000)   I&O: I/O last 3 completed shifts: In: -  Out: 650 [Urine:350; Blood:300]          No intake/output data recorded.  Lungs: Clear and unlabored  Heart: regular rate and rhythm / no murmurs  Abdomen: soft, non-tender, non-distended             Fundus: firm, non-tender, U-1  Perineum: minimal edema, intact  Lochia: small  Extremities: no edema, no calf pain or tenderness    A/P: PPD # 1 36 y.o., [id number]   Principal Problem:   SVD 5/17 Active Problems:   Postpartum care following vaginal delivery   HTN in pregnancy, chronic  - stable on labetalol 100 mg BID  - continue as established   GDM, class A2  - continue diabetic diet  - 2 GTT at PP visit   Maternal anemia, with delivery  - started oral Fe and Mag Ox, continue to PP visit   Depression with anxiety  - managed on Celexa 20 mg and Buspar 5 mg PRN  - hx of exacerbation after birth of  first child, may increase Celexa to 40 mg / day as needed  - encouraged counseling   Doing well - stable status  Routine post partum orders  DC home today w/ instructions  F/U at [facility] in 6 weeks and PRN    Neta Mends, MSN, CNM 03/12/2018, 9:33 AM

## 2018-03-12 NOTE — Progress Notes (Signed)
Mother of baby was referred for history of depression and anxiety. Referral screened out by CSW because per chart review, patient is actively taking Buspar and Celexa for her diagnosis.   Please contact CSW if mother of baby requests, if needs arise, or if mother of baby scores greater than a nine or answers yes to question ten on [place] Postpartum Depression Screen.  Edwin Dada, MSW, LCSW-A Clinical Social Worker [facility] [phone]

## 2018-03-12 NOTE — Anesthesia Postprocedure Evaluation (Signed)
Anesthesia Post Note  Patient: Chelsea Frost  Procedure(s) Performed: AN AD HOC LABOR EPIDURAL     Patient location during evaluation: Mother Baby Anesthesia Type: Epidural Level of consciousness: awake and alert Pain management: pain level controlled Vital Signs Assessment: post-procedure vital signs reviewed and stable Respiratory status: spontaneous breathing, nonlabored ventilation and respiratory function stable Cardiovascular status: stable Postop Assessment: no headache, no backache and epidural receding Anesthetic complications: no    Last Vitals:  Vitals:   03/11/18 2126 03/12/18 0600  BP: 136/84 (!) 98/55  Pulse: 73 (!) 50  Resp: 18 18  Temp: 37.2 C 36.7 C  SpO2:      Last Pain:  Vitals:   03/12/18 0700  TempSrc:   PainSc: Asleep   Pain Goal: Patients Stated Pain Goal: 4 (03/11/18 0005)               Andalyn Heckstall S

## 2018-05-31 ENCOUNTER — Ambulatory Visit: Payer: Managed Care, Other (non HMO) | Admitting: Neurology

## 2018-06-02 ENCOUNTER — Other Ambulatory Visit: Payer: Self-pay

## 2018-06-02 ENCOUNTER — Encounter: Payer: Self-pay | Admitting: Neurology

## 2018-06-02 ENCOUNTER — Ambulatory Visit (INDEPENDENT_AMBULATORY_CARE_PROVIDER_SITE_OTHER): Payer: Managed Care, Other (non HMO) | Admitting: Neurology

## 2018-06-02 VITALS — BP 126/92 | HR 104 | Resp 14 | Ht 62.75 in | Wt 188.0 lb

## 2018-06-02 DIAGNOSIS — IMO0002 Reserved for concepts with insufficient information to code with codable children: Secondary | ICD-10-CM

## 2018-06-02 DIAGNOSIS — G43709 Chronic migraine without aura, not intractable, without status migrainosus: Secondary | ICD-10-CM | POA: Diagnosis not present

## 2018-06-02 MED ORDER — NARATRIPTAN HCL 2.5 MG PO TABS: 2.5000 mg | ORAL_TABLET | ORAL | 6 refills | Status: DC | PRN

## 2018-06-02 MED ORDER — METAXALONE 800 MG PO TABS: 800.0000 mg | ORAL_TABLET | ORAL | 6 refills | Status: DC | PRN

## 2018-06-02 MED ORDER — TOPIRAMATE 100 MG PO TABS: 100.0000 mg | ORAL_TABLET | Freq: Two times a day (BID) | ORAL | 11 refills | Status: DC

## 2018-06-02 NOTE — Progress Notes (Signed)
PATIENT: Chelsea Frost DOB: 05/29/1981  Chief Complaint  Patient presents with  . Migraines     HISTORICAL  MICHEL Frost is a 37 year old female, seen in refer by  her primary care doctor Devra Dopp  for evaluation of migraine headaches, initial evaluation was on July 15 2017.  Reviewed and summarized the referring note, she had a history of  obesity, impaired glucose intolerance, hypertension, vitamin D deficiency, anxiety disorder, acid reflux, chronic migraine headaches,  She had migraine headaches since twenties, her typical migraine starting from neck, spreading forward, become severe pounding headache, with high-pitched noise, smell sensitivity, nauseous, mild light sensitivity, movement make her headache worse,  She was under the care of  headache and neck pain clinic by Dr.Lewit, most recent visit was on Mar 24 2017.  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  She has been taking Fioricet about twice a week,  She has abnormal glucose during the pregnancy, is taking saxenda  now.  Previously Topamax worked well as migraine prevention, Amerge works as migraine abortive treatment  Review of system: Review of system was performed on 14 system, relevant is as above,  ALLERGIES: Allergies  Allergen Reactions  . Imitrex [Sumatriptan] Other (See Comments)    Jaw issues   . Amoxicillin Rash    Has patient had a PCN reaction causing immediate rash, facial/tongue/throat swelling, SOB or lightheadedness with hypotension: No Has patient had a PCN reaction causing severe rash involving mucus membranes or skin necrosis: No Has patient had a PCN reaction that required hospitalization: No Has patient had a PCN reaction occurring within the last 10 years: Yes If all of the above answers are "NO", then may proceed with Cephalosporin use.     HOME MEDICATIONS: Current Outpatient Medications  Medication Sig Dispense Refill  . acetaminophen (TYLENOL) 500 MG tablet Take 1,000 mg by mouth every 6 (six) hours as needed for moderate pain.    . busPIRone (BUSPAR) 5 MG tablet Take 5 mg by mouth 3 (three) times daily.    . butalbital-acetaminophen-caffeine (FIORICET, ESGIC) 50-325-40 MG tablet TAKE 1 TABLET BY MOUTH EVERY 6 HOURS AS NEEDED FOR HEADACHE 15 tablet 5  . citalopram (CELEXA) 40 MG tablet Take by mouth.    . cyclobenzaprine (FLEXERIL) 10 MG tablet Take 10 mg by mouth 3 (three) times daily as needed for muscle spasms.    Marland Kitchen ibuprofen (ADVIL,MOTRIN) 600 MG tablet Take 1 tablet (600 mg total) by mouth every 6 (six) hours. 30 tablet 0  . IRON PO Take 1 tablet by mouth every other day.    . levocetirizine (XYZAL) 5 MG tablet Take 5 mg by mouth every evening.    Marland Kitchen  Liraglutide -Weight Management (SAXENDA) 18 MG/3ML SOPN Inject into the skin.    . magnesium oxide (MAG-OX) 400 MG tablet Take 400 mg by mouth 2 (two) times daily.    . metoprolol succinate (TOPROL-XL) 50 MG 24 hr tablet TAKE ONE TABLET (50 MG DOSE) BY MOUTH DAILY.  1  . ranitidine (ZANTAC) 150 MG tablet Take by  mouth.    . riboflavin (VITAMIN B-2) 100 MG TABS tablet Take 100 mg by mouth at bedtime.     No current facility-administered medications for this visit.     PAST MEDICAL HISTORY: Past Medical History:  Diagnosis Date  . Anxiety   . Eczema   . GERD (gastroesophageal reflux disease)   . Gestational diabetes    glyburide  . Gestational diabetes mellitus (GDM), antepartum   . Hx of varicella   . Hypertension    labetolol 200 mg bid  . Migraine     PAST SURGICAL HISTORY: Past Surgical History:  Procedure Laterality Date  . BREAST REDUCTION SURGERY    . KNEE SURGERY Left   . WISDOM TOOTH EXTRACTION      FAMILY HISTORY: Family History  Problem Relation Age of Onset  . Lupus Father        lupus anticoagulant  . Pulmonary embolism Father   . Hiatal hernia Father   . Arrhythmia Father   . Deep vein thrombosis Father   . Migraines Father   . GER disease Father   . Multiple sclerosis Paternal Aunt   . Lupus Paternal Aunt   . COPD Maternal Grandmother   . Cancer Maternal Grandfather        colon  . Hypertension Mother   . ADD / ADHD Sister     SOCIAL HISTORY:  Social History   Socioeconomic History  . Marital status: Married    Spouse name: Not on file  . Number of children: 1  . Years of education: Masters  . Highest education level: Not on file  Occupational History  . Occupation: Radiation protection practitionerpeech/Language Pathologist  Social Needs  . Financial resource strain: Not on file  . Food insecurity:    Worry: Not on file    Inability: Not on file  . Transportation needs:    Medical: Not on file    Non-medical: Not on file  Tobacco Use  . Smoking status: Never Smoker  . Smokeless tobacco: Never Used  Substance and Sexual Activity  . Alcohol use: Yes    Comment: Occasional use  . Drug use: No  . Sexual activity: Not on file  Lifestyle  . Physical activity:    Days per week: Not on file    Minutes per session: Not on file  . Stress: Not on file  Relationships  .  Social connections:    Talks on phone: Not on file    Gets together: Not on file    Attends religious service: Not on file    Active member of club or organization: Not on file    Attends meetings of clubs or organizations: Not on file    Relationship status: Not on file  . Intimate partner violence:    Fear of current or ex partner: Not on file    Emotionally abused: Not on file    Physically abused: Not on file    Forced sexual activity: Not on file  Other Topics Concern  . Not on file  Social History Narrative   Lives at home with her husband and child.  Right-handed.   Two 12oz bottles of Diet Dr. Reino Kent per day.     PHYSICAL EXAM   Vitals:   06/02/18 0718  BP: (!) 126/92  Pulse: (!) 104  Resp: 14  Weight: 188 lb (85.3 kg)  Height: 5' 2.75" (1.594 m)    Not recorded      Body mass index is 33.57 kg/m.  PHYSICAL EXAMNIATION:  Gen: NAD, conversant, well nourised, obese, well groomed                     Cardiovascular: Regular rate rhythm, no peripheral edema, warm, nontender. Eyes: Conjunctivae clear without exudates or hemorrhage Neck: Supple, no carotid bruits. Pulmonary: Clear to auscultation bilaterally   NEUROLOGICAL EXAM:  MENTAL STATUS: Speech:    Speech is normal; fluent and spontaneous with normal comprehension.  Cognition:     Orientation to time, place and person     Normal recent and remote memory     Normal Attention span and concentration     Normal Language, naming, repeating,spontaneous speech     Fund of knowledge   CRANIAL NERVES: CN II: Visual fields are full to confrontation. Fundoscopic exam is normal with sharp discs and no vascular changes. Pupils are round equal and briskly reactive to light. CN III, IV, VI: extraocular movement are normal. No ptosis. CN V: Facial sensation is intact to pinprick in all 3 divisions bilaterally. Corneal responses are intact.  CN VII: Face is symmetric with normal eye closure and smile. CN VIII:  Hearing is normal to rubbing fingers CN IX, X: Palate elevates symmetrically. Phonation is normal. CN XI: Head turning and shoulder shrug are intact CN XII: Tongue is midline with normal movements and no atrophy.  MOTOR: There is no pronator drift of out-stretched arms. Muscle bulk and tone are normal. Muscle strength is normal.  REFLEXES: Reflexes are 2+ and symmetric at the biceps, triceps, knees, and ankles. Plantar responses are flexor.  SENSORY: Intact to light touch, pinprick, positional sensation and vibratory sensation are intact in fingers and toes.  COORDINATION: Rapid alternating movements and fine finger movements are intact. There is no dysmetria on finger-to-nose and heel-knee-shin.    GAIT/STANCE: Posture is normal. Gait is steady with normal steps, base, arm swing, and turning. Heel and toe walking are normal. Tandem gait is normal.  Romberg is absent.   DIAGNOSTIC DATA (LABS, IMAGING, TESTING) - I reviewed patient records, labs, notes, testing and imaging myself where available.   ASSESSMENT AND PLAN  SADEEL FIDDLER is a 37 y.o. female    Chronic migraine headache,   Restart Topamax titrating to 100 mg twice a day  Amerge as needed.  Skelaxin as needed.  Levert Feinstein, M.D. Ph.D.  [facility] [address] [place], [place] [address] Ph: [phone] Fax: [phone]  CC: Devra Dopp, MD

## 2018-07-06 ENCOUNTER — Encounter: Payer: Self-pay | Admitting: Physical Therapy

## 2018-07-06 ENCOUNTER — Ambulatory Visit: Payer: Managed Care, Other (non HMO) | Attending: Nurse Practitioner | Admitting: Physical Therapy

## 2018-07-06 ENCOUNTER — Other Ambulatory Visit: Payer: Self-pay

## 2018-07-06 DIAGNOSIS — M25512 Pain in left shoulder: Secondary | ICD-10-CM

## 2018-07-06 DIAGNOSIS — R293 Abnormal posture: Secondary | ICD-10-CM

## 2018-07-06 DIAGNOSIS — M357 Hypermobility syndrome: Secondary | ICD-10-CM | POA: Diagnosis present

## 2018-07-06 NOTE — Therapy (Signed)
[facility] [address] [place], [place], [address] Phone: [phone]   Fax:  [phone]  Physical Therapy Evaluation  Patient Details  Name: Chelsea Frost MRN: [id number] Date of Birth: [birth year] Referring Provider: Azzie Roup, FNP   Encounter Date: 07/06/2018  PT End of Session - 07/06/18 1008    Visit Number  1    Number of Visits  12    Date for PT Re-Evaluation  08/17/18    PT Start Time  0932    PT Stop Time  1025    PT Time Calculation (min)  53 min    Activity Tolerance  Patient tolerated treatment well    Behavior During Therapy  [facility] for tasks assessed/performed       Past Medical History:  Diagnosis Date  . Anxiety   . Eczema   . GERD (gastroesophageal reflux disease)   . Gestational diabetes    glyburide  . Gestational diabetes mellitus (GDM), antepartum   . Hx of varicella   . Hypertension    labetolol 200 mg bid  . Migraine     Past Surgical History:  Procedure Laterality Date  . BREAST REDUCTION SURGERY    . KNEE SURGERY Left   . WISDOM TOOTH EXTRACTION      There were no vitals filed for this visit.   Subjective Assessment - 07/06/18 0937    Subjective  Pt presents with acute shoulder pain, about 6 weeks ago.  Pain increased mostly with reaching out to the side and back. She is about 4 mos post partum.  Denies weakness, sensory changes.  She states she is hypermobile in her shoulders so she has a relative tightness in L shoulder. She has difficulty getting comfortable at night.  Not limited with lifting or caring for child.  Pain with elliptical (UE work).  She has muscle spasm in L upper trap.  Reports associated headaches.     Pertinent History  previous injuries requiring PT, anxiety, eczema    Diagnostic tests  none     Currently in Pain?  No/denies    Pain Location  Shoulder    Pain Orientation  Left    Pain Descriptors / Indicators  Sharp    Pain Type  Acute pain    Pain  Radiating Towards  neck     Pain Onset  More than a month ago    Pain Frequency  Intermittent    Aggravating Factors   reaching back and out     Pain Relieving Factors  rest, heat, muscle relaxer    Multiple Pain Sites  No         [facility] PT Assessment - 07/06/18 0001      Assessment   Medical Diagnosis  L shoulder pain     Referring Provider  Azzie Roup, FNP    Onset Date/Surgical Date  --   6 weeks    Hand Dominance  Right    Prior Therapy  Yes       Precautions   Precautions  None      Restrictions   Weight Bearing Restrictions  No      Balance Screen   Has the patient fallen in the past 6 months  No      Home Environment   Living Environment  Private residence    Living Arrangements  Spouse/significant other;Children    Additional Comments  2 yrs old, 4 mos infant      Prior Function  Level of Independence  Independent    Vocation  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    Leisure  not much, busy mom      Cognition   Overall Cognitive Status  Within Functional Limits for tasks assessed      Observation/Other Assessments   Focus on Therapeutic Outcomes (FOTO)   40%      Sensation   Light Touch  Appears Intact      Posture/Postural Control   Posture/Postural Control  Postural limitations    Postural Limitations  Rounded Shoulders;Forward head    Posture Comments  L shoulder higher, protracted and humeral head more anterior , IR       AROM   Right Shoulder Flexion  170 Degrees    Right Shoulder ABduction  170 Degrees    Left Shoulder Flexion  155 Degrees    Left Shoulder ABduction  170 Degrees    Left Shoulder Internal Rotation  75 Degrees   Fr to T4   Left Shoulder External Rotation  90 Degrees   Fr to T2     PROM   Overall PROM Comments  not painful       Strength   Right Shoulder Flexion  4+/5    Right Shoulder ABduction  4+/5    Left Shoulder Flexion  4+/5    Left Shoulder ABduction  4+/5    Left Shoulder Internal  Rotation  4+/5    Left Shoulder External Rotation  4/5      Palpation   Palpation comment  TTP L upper trap, ant lateral deltoid, sore in lateral neck, rhomboids, subscapularis       Empty Can test   Findings  Positive      Ambulation/Gait   Gait Comments  no issues                 Objective measurements completed on examination: See above findings.      [facility] Adult PT Treatment/Exercise - 07/06/18 0001      Shoulder Exercises: Supine   Horizontal ABduction  Strengthening;Both;10 reps;Theraband    Theraband Level (Shoulder Horizontal ABduction)  Level 2 (Red)    External Rotation  Strengthening;Both;10 reps    Theraband Level (Shoulder External Rotation)  Level 2 (Red)    Flexion  Strengthening;Both;10 reps    Theraband Level (Shoulder Flexion)  Level 2 (Red)    Shoulder Flexion Weight (lbs)  narrow grip     Diagonals  Strengthening;Left;10 reps    Theraband Level (Shoulder Diagonals)  Level 2 (Red)    Other Supine Exercises  sash and ER done in seated with more effectiveness       Shoulder Exercises: Stretch   Other Shoulder Stretches  post capsule sleeper stretch x 5       Cryotherapy   Number Minutes Cryotherapy  10 Minutes    Cryotherapy Location  Shoulder    Type of Cryotherapy  Ice pack             PT Education - 07/06/18 1429    Education Details  PT/POC, eval findings, HEP, posture     Person(s) Educated  Patient    Methods  Explanation;Demonstration;Tactile cues;Verbal cues;Handout    Comprehension  Verbalized understanding;Returned demonstration          PT Long Term Goals - 07/06/18 2106      PT LONG TERM GOAL #1   Title  Pt will be I with full HEP for posture, stretching  and strength    Time  6    Period  Weeks    Status  New    Target Date  08/17/18      PT LONG TERM GOAL #2   Title  Pt will improve her FOTO score to <25% limited to demo improved function.    Time  6    Period  Weeks    Status  New    Target Date  08/17/18       PT LONG TERM GOAL #3   Title  Pt wil have no pain reaching with LUE in all directions    Baseline       Time  6    Status  New    Target Date  08/17/18      PT LONG TERM GOAL #4   Title  Pt will be able to sleep without disturbance from pain most nights of the week.     Time  6    Period  Weeks    Status  New    Target Date  08/17/18      PT LONG TERM GOAL #5   Title  Pt will understand posture, joint stability and body mechanics as it relates to pain and upper body function.     Time  6    Period  Weeks    Status  New    Target Date  08/17/18             Plan - 07/06/18 1430    Clinical Impression Statement  Pt presents for low complexity eval of L shoulder pain which began about 6 weeks ago.  She has signs and symptoms consistent with mild L shoulder impingement. She has posterior capsule tightness L >R.  She has postural symmetries and periscapular pain, muscle knots.  She has tension in neck and headaches which may be related to abnormal postural alignment. She demonstrates shoulder and elbow hypermobility.  She should do well with PT intervention to allow her full use of LUE .     Clinical Presentation  Stable    Clinical Decision Making  Low    Rehab Potential  Excellent    PT Frequency  2x / week    PT Duration  6 weeks    PT Treatment/Interventions  ADLs/Self Care Home Management;Electrical Stimulation;Functional mobility training;Neuromuscular re-education;Taping;Cryotherapy;Ultrasound;Moist Heat;Balance training;Therapeutic exercise;Iontophoresis 4mg /ml Dexamethasone;Patient/family education;Manual techniques;Passive range of motion;Dry needling;Therapeutic activities    PT Next Visit Plan  check HEP, scapular stabilization, posture, [place] and /or ionto     PT Home Exercise Plan  Supine scapular stab, sleeper stretch     Consulted and Agree with Plan of Care  Patient       Patient will benefit from skilled therapeutic intervention in order to improve the  following deficits and impairments:  Increased muscle spasms, Pain, Postural dysfunction, Impaired UE functional use, Increased fascial restricitons, Hypermobility, Decreased strength, Decreased range of motion  Visit Diagnosis: Acute pain of left shoulder  Abnormal posture  Hypermobility syndrome     Problem List Patient Active Problem List   Diagnosis Date Noted  . SVD 5/17 03/12/2018  . HTN in pregnancy, chronic 03/12/2018  . GDM, class A2 03/12/2018  . Maternal anemia, with delivery 03/12/2018  . Depression with anxiety 03/12/2018  . Chronic migraine 07/15/2017  . Postpartum care following vaginal delivery 02/07/2016    PAA,JENNIFER 07/06/2018, 9:14 PM  [facility] [address] [place], [place], [address] Phone: [phone]  Fax:  [phone]  Name: Chelsea Frost MRN: [id number] Date of Birth: [birth year]    Karie Mainland, PT 07/06/18 9:15 PM Phone: [phone] Fax: [phone]

## 2018-07-06 NOTE — Patient Instructions (Signed)
Over Head Pull: Narrow Grip       On back, knees bent, feet flat, band across thighs, elbows straight but relaxed. Pull hands apart (start). Keeping elbows straight, bring arms up and over head, hands toward floor. Keep pull steady on band. Hold momentarily. Return slowly, keeping pull steady, back to start. Repeat __10-20_ times. Band color ___RED___   Side Pull: Double Arm   On back, knees bent, feet flat. Arms perpendicular to body, shoulder level, elbows straight but relaxed. Pull arms out to sides, elbows straight. Resistance band comes across collarbones, hands toward floor. Hold momentarily. Slowly return to starting position. Repeat _10-20__ times. Band color ___RED__   Sash   On back, knees bent, feet flat, left hand on left hip, right hand above left. Pull right arm DIAGONALLY (hip to shoulder) across chest. Bring right arm along head toward floor. Hold momentarily. Slowly return to starting position. Repeat _10-20__ times. Do with left arm. Band color ___RED___   Shoulder Rotation: Double Arm   On back, knees bent, feet flat, elbows tucked at sides, bent 90, hands palms up. Pull hands apart and down toward floor, keeping elbows near sides. Hold momentarily. Slowly return to starting position. Repeat _10-20__ times. Band color ___RED___

## 2018-07-11 ENCOUNTER — Encounter: Payer: Managed Care, Other (non HMO) | Admitting: Physical Therapy

## 2018-07-15 ENCOUNTER — Encounter

## 2018-07-19 ENCOUNTER — Ambulatory Visit: Payer: Managed Care, Other (non HMO) | Admitting: Physical Therapy

## 2018-07-19 ENCOUNTER — Encounter: Payer: Self-pay | Admitting: Physical Therapy

## 2018-07-19 DIAGNOSIS — M25512 Pain in left shoulder: Secondary | ICD-10-CM

## 2018-07-19 DIAGNOSIS — R293 Abnormal posture: Secondary | ICD-10-CM

## 2018-07-19 DIAGNOSIS — M357 Hypermobility syndrome: Secondary | ICD-10-CM

## 2018-07-19 NOTE — Therapy (Signed)
[facility]patient Rehabilitation [facility] [address] [place], [place], [address] Phone: [phone]   Fax:  [phone]  Physical Therapy Treatment  Patient Details  Name: Chelsea Frost MRN: [id number] Date of Birth: 08/01/1981 Referring Provider: Azzie Roup, FNP   Encounter Date: 07/19/2018  PT End of Session - 07/19/18 1633    Visit Number  2    Number of Visits  12    Date for PT Re-Evaluation  08/17/18    PT Start Time  1547    PT Stop Time  1645    PT Time Calculation (min)  58 min    Activity Tolerance  Patient tolerated treatment well    Behavior During Therapy  [facility] for tasks assessed/performed       Past Medical History:  Diagnosis Date  . Anxiety   . Eczema   . GERD (gastroesophageal reflux disease)   . Gestational diabetes    glyburide  . Gestational diabetes mellitus (GDM), antepartum   . Hx of varicella   . Hypertension    labetolol 200 mg bid  . Migraine     Past Surgical History:  Procedure Laterality Date  . BREAST REDUCTION SURGERY    . KNEE SURGERY Left   . WISDOM TOOTH EXTRACTION      There were no vitals filed for this visit.  Subjective Assessment - 07/19/18 1545    Subjective  Missed last week due to sick child. Noticed decreased sensation L forearm extensors (pain and numbness) , muscle fatigued.    Did get better when she stopped her exercises.  But then it returned.  Tingling in shoulder blade  but ROM is better.      Currently in Pain?  Yes    Pain Score  1     Pain Location  Shoulder    Pain Orientation  Left    Pain Type  Acute pain    Pain Radiating Towards  forearm     Pain Onset  More than a month ago    Pain Frequency  Intermittent          [facility] Adult PT Treatment/Exercise - 07/19/18 0001      Self-Care   Self-Care  Heat/Ice Application;Other Self-Care Comments    Heat/Ice Application  heat     Other Self-Care Comments   tennis ball       Shoulder Exercises: Supine   Horizontal  ABduction  Strengthening;Both;10 reps;Theraband    Theraband Level (Shoulder Horizontal ABduction)  Level 2 (Red)    External Rotation  Strengthening;Both;10 reps    Theraband Level (Shoulder External Rotation)  Level 2 (Red)    Flexion  Strengthening;Both;10 reps    Theraband Level (Shoulder Flexion)  Level 2 (Red)    Shoulder Flexion Weight (lbs)  narrow grip     Diagonals  Strengthening;Left;10 reps    Theraband Level (Shoulder Diagonals)  Level 2 (Red)    Other Supine Exercises  these were all done in standing against the wall: sash and ER done in seated with more effectiveness       Moist Heat Therapy   Number Minutes Moist Heat  10 Minutes    Moist Heat Location  Shoulder      Cryotherapy   Number Minutes Cryotherapy  --    Cryotherapy Location  --    Type of Cryotherapy  --      Manual Therapy   Manual therapy comments  gentle suboccipital release    Joint Mobilization  GR  I-II spinal mobs to T spine.  Rotational mobs upper thoracic.     Soft tissue mobilization  posterior cervicals, forearm extensors, upper trap, levator scap   periscapular mm    Scapular Mobilization  sidelying     Passive ROM  cervical and L UE              PT Education - 07/19/18 1633    Education Details  differential diagnosis, tennisball, trigger points     Person(s) Educated  Patient    Methods  Explanation;Handout    Comprehension  Verbalized understanding          PT Long Term Goals - 07/19/18 1633      PT LONG TERM GOAL #1   Title  Pt will be I with full HEP for posture, stretching and strength    Status  On-going      PT LONG TERM GOAL #2   Title  Pt will improve her FOTO score to <25% limited to demo improved function.    Status  On-going      PT LONG TERM GOAL #3   Title  Pt wil have no pain reaching with LUE in all directions    Status  On-going      PT LONG TERM GOAL #4   Title  Pt will be able to sleep without disturbance from pain most nights of the week.      Status  On-going      PT LONG TERM GOAL #5   Title  Pt will understand posture, joint stability and body mechanics as it relates to pain and upper body function.     Status  On-going            Plan - 07/19/18 1634    Clinical Impression Statement  Began POC today.  Pain presenting as myofascial pain today with much of the pain around the scapula.  Tight through thoracic spine, tender central C7 to T4.  Spurling was neg BUT neck felt discomfort and instability to L (patient perceived)   She had less pain post session.      PT Next Visit Plan  check HEP, scapular stabilization, posture, US and /or ionto     PT Home Exercise Plan  Supine scapular stab, sleeper stretch , tennis ball     Consulted and Agree with Plan of Care  Patient       Patient will benefit from skilled therapeutic intervention in order to improve the following deficits and impairments:  Increased muscle spasms, Pain, Postural dysfunction, Impaired UE functional use, Increased fascial restricitons, Hypermobility, Decreased strength, Decreased range of motion  Visit Diagnosis: Acute pain of left shoulder  Abnormal posture  Hypermobility syndrome     Problem List Patient Active Problem List   Diagnosis Date Noted  . SVD 5/17 03/12/2018  . HTN in pregnancy, chronic 03/12/2018  . GDM, class A2 03/12/2018  . Maternal anemia, with delivery 03/12/2018  . Depression with anxiety 03/12/2018  . Chronic migraine 07/15/2017  . Postpartum care following vaginal delivery 02/07/2016    Kelsa Jaworowski 07/19/2018, 4:38 PM  [facility] [address] [place], [place], [address] Phone: [phone]   Fax:  [phone]  Name: Chelsea Frost MRN: [id number] Date of Birth: [birth year]  Karie MainlandJennifer Breuna Loveall, PT 07/19/18 4:38 PM Phone: [phone] Fax: [phone]

## 2018-07-21 ENCOUNTER — Encounter: Payer: Self-pay | Admitting: Physical Therapy

## 2018-07-21 ENCOUNTER — Ambulatory Visit: Payer: Managed Care, Other (non HMO) | Admitting: Physical Therapy

## 2018-07-21 DIAGNOSIS — M25512 Pain in left shoulder: Secondary | ICD-10-CM | POA: Diagnosis not present

## 2018-07-21 DIAGNOSIS — M357 Hypermobility syndrome: Secondary | ICD-10-CM

## 2018-07-21 DIAGNOSIS — R293 Abnormal posture: Secondary | ICD-10-CM

## 2018-07-21 NOTE — Therapy (Signed)
[facility] Outpatient Rehabilitation [facility] [address] [place], [place], [address] Phone: [phone]   Fax:  [phone]  Physical Therapy Treatment  Patient Details  Name: Chelsea Frost MRN: [id number] Date of Birth: May 29, 1981 Referring Provider (PT): Azzie Roup, FNP   Encounter Date: 07/21/2018  PT End of Session - 07/21/18 1717    Visit Number  3    Number of Visits  12    Date for PT Re-Evaluation  08/17/18    PT Start Time  1717    PT Stop Time  1752    PT Time Calculation (min)  35 min    Activity Tolerance  Patient tolerated treatment well    Behavior During Therapy  [facility] for tasks assessed/performed       Past Medical History:  Diagnosis Date  . Anxiety   . Eczema   . GERD (gastroesophageal reflux disease)   . Gestational diabetes    glyburide  . Gestational diabetes mellitus (GDM), antepartum   . Hx of varicella   . Hypertension    labetolol 200 mg bid  . Migraine     Past Surgical History:  Procedure Laterality Date  . BREAST REDUCTION SURGERY    . KNEE SURGERY Left   . WISDOM TOOTH EXTRACTION      There were no vitals filed for this visit.  Subjective Assessment - 07/21/18 1718    Subjective  Shoulder is sore. Having some problems with ROM reaching behind yesterday. Rt upper trap is a little sore after manual therapy. Numbness in Lt FA is better.     Currently in Pain?  Yes    Pain Score  3     Pain Location  Neck    Pain Orientation  Right;Left                       [facility] Adult PT Treatment/Exercise - 07/21/18 0001      Exercises   Exercises  Shoulder      Shoulder Exercises: Prone   Extension  15 reps    Extension Weight (lbs)  1    Other Prone Exercises  prone row 1#      Shoulder Exercises: Sidelying   External Rotation  20 reps    External Rotation Weight (lbs)  1    ABduction  15 reps    ABduction Weight (lbs)  1      Shoulder Exercises: Standing   Horizontal ABduction   Strengthening;10 reps    Theraband Level (Shoulder Horizontal ABduction)  Level 2 (Red)    External Rotation  10 reps    Theraband Level (Shoulder External Rotation)  Level 2 (Red)    Diagonals  20 reps    Theraband Level (Shoulder Diagonals)  Level 2 (Red)      Shoulder Exercises: Stretch   Other Shoulder Stretches  upper trap      Manual Therapy   Manual Therapy  Taping    Manual therapy comments  skilled palpation and monitoring during TPDN    Joint Mobilization  Lt first rib depression, gross rib ER & thoracic PA    Soft tissue mobilization  Lt upper trap & levator    Kinesiotex  Inhibit Muscle      Kinesiotix   Inhibit Muscle   upper trap       Trigger Point Dry Needling - 07/21/18 1755    Consent Given?  Yes    Education Handout Provided  --   verbal  education   Muscles Treated Upper Body  Upper trapezius   Lt   Upper Trapezius Response  Twitch reponse elicited;Palpable increased muscle length                PT Long Term Goals - 07/19/18 1633      PT LONG TERM GOAL #1   Title  Pt will be I with full HEP for posture, stretching and strength    Status  On-going      PT LONG TERM GOAL #2   Title  Pt will improve her FOTO score to <25% limited to demo improved function.    Status  On-going      PT LONG TERM GOAL #3   Title  Pt wil have no pain reaching with LUE in all directions    Status  On-going      PT LONG TERM GOAL #4   Title  Pt will be able to sleep without disturbance from pain most nights of the week.     Status  On-going      PT LONG TERM GOAL #5   Title  Pt will understand posture, joint stability and body mechanics as it relates to pain and upper body function.     Status  On-going            Plan - 07/21/18 1756    Clinical Impression Statement  Significant twitch response elicited in Lt upper trap. Pt demo Lt GHJ elevation at rest vs Rt but did decrease after DN. Poor control in unstable positions of arm resulting in  overactivation of upper trap and tightness. placed ktape on Lt upper trap after treatment for tactile cuing to maintain depression     PT Treatment/Interventions  ADLs/Self Care Home Management;Electrical Stimulation;Functional mobility training;Neuromuscular re-education;Taping;Cryotherapy;Ultrasound;Moist Heat;Balance training;Therapeutic exercise;Iontophoresis 4mg /ml Dexamethasone;Patient/family education;Manual techniques;Passive range of motion;Dry needling;Therapeutic activities    PT Next Visit Plan  check HEP, scapular stabilization, posture, [place] and /or ionto, outcome of DN    PT Home Exercise Plan  Supine scapular stab, sleeper stretch , tennis ball     Consulted and Agree with Plan of Care  Patient       Patient will benefit from skilled therapeutic intervention in order to improve the following deficits and impairments:  Increased muscle spasms, Pain, Postural dysfunction, Impaired UE functional use, Increased fascial restricitons, Hypermobility, Decreased strength, Decreased range of motion  Visit Diagnosis: Acute pain of left shoulder  Abnormal posture  Hypermobility syndrome     Problem List Patient Active Problem List   Diagnosis Date Noted  . SVD 5/17 03/12/2018  . HTN in pregnancy, chronic 03/12/2018  . GDM, class A2 03/12/2018  . Maternal anemia, with delivery 03/12/2018  . Depression with anxiety 03/12/2018  . Chronic migraine 07/15/2017  . Postpartum care following vaginal delivery 02/07/2016    Kobe Ofallon C. Brindy Higginbotham PT, DPT 07/21/18 5:59 PM   [facility] Outpatient Rehabilitation [facility] [address] [place], [place], [address] Phone: [phone]   Fax:  [phone]  Name: Chelsea Frost MRN: [id number] Date of Birth: [birth year]

## 2018-07-25 ENCOUNTER — Encounter: Payer: Managed Care, Other (non HMO) | Admitting: Physical Therapy

## 2018-07-28 ENCOUNTER — Encounter: Payer: Self-pay | Admitting: Physical Therapy

## 2018-07-28 ENCOUNTER — Ambulatory Visit: Payer: 59 | Attending: Nurse Practitioner | Admitting: Physical Therapy

## 2018-07-28 DIAGNOSIS — R293 Abnormal posture: Secondary | ICD-10-CM | POA: Insufficient documentation

## 2018-07-28 DIAGNOSIS — M25512 Pain in left shoulder: Secondary | ICD-10-CM

## 2018-07-28 DIAGNOSIS — M357 Hypermobility syndrome: Secondary | ICD-10-CM | POA: Diagnosis present

## 2018-07-28 NOTE — Therapy (Signed)
[facility] Outpatient Rehabilitation [facility] [address] [place], [place], [address] Phone: [phone]   Fax:  [phone]  Physical Therapy Treatment  Patient Details  Name: Chelsea Frost MRN: [id number] Date of Birth: [birth year] Referring Provider (PT): Azzie Roup, FNP   Encounter Date: 07/28/2018  PT End of Session - 07/28/18 1544    Visit Number  4    Number of Visits  12    Date for PT Re-Evaluation  08/17/18    PT Start Time  1545    PT Stop Time  1624    PT Time Calculation (min)  39 min    Activity Tolerance  Patient tolerated treatment well    Behavior During Therapy  [facility] for tasks assessed/performed       Past Medical History:  Diagnosis Date  . Anxiety   . Eczema   . GERD (gastroesophageal reflux disease)   . Gestational diabetes    glyburide  . Gestational diabetes mellitus (GDM), antepartum   . Hx of varicella   . Hypertension    labetolol 200 mg bid  . Migraine     Past Surgical History:  Procedure Laterality Date  . BREAST REDUCTION SURGERY    . KNEE SURGERY Left   . WISDOM TOOTH EXTRACTION      There were no vitals filed for this visit.  Subjective Assessment - 07/28/18 1545    Subjective  No pain today, more aware of posture, reaching has improved. Feels isolated pain in FA when holding daughter.     Currently in Pain?  No/denies                       [facility] Adult PT Treatment/Exercise - 07/28/18 0001      Exercises   Exercises  Shoulder      Shoulder Exercises: Supine   Other Supine Exercises  rythmic stabs at 90 2x1 min    Other Supine Exercises  resisted PNF diagonals   extension irritable     Shoulder Exercises: Standing   Protraction Limitations  small circles on plyo ball on table    External Rotation  20 reps    Theraband Level (Shoulder External Rotation)  Level 2 (Red)    Extension  20 reps    Theraband Level (Shoulder Extension)  Level 2 (Red)    Row  20 reps    Theraband  Level (Shoulder Row)  Level 3 (Green)    Other Standing Exercises  weight shifting on table    Other Standing Exercises  ER red tband elbows on table      Manual Therapy   Manual therapy comments  radial nerve glides    Soft tissue mobilization  periscapular, sidelying scapular distraction                  PT Long Term Goals - 07/19/18 1633      PT LONG TERM GOAL #1   Title  Pt will be I with full HEP for posture, stretching and strength    Status  On-going      PT LONG TERM GOAL #2   Title  Pt will improve her FOTO score to <25% limited to demo improved function.    Status  On-going      PT LONG TERM GOAL #3   Title  Pt wil have no pain reaching with LUE in all directions    Status  On-going      PT LONG TERM GOAL #  4   Title  Pt will be able to sleep without disturbance from pain most nights of the week.     Status  On-going      PT LONG TERM GOAL #5   Title  Pt will understand posture, joint stability and body mechanics as it relates to pain and upper body function.     Status  On-going            Plan - 07/28/18 1626    Clinical Impression Statement  Improved ability to reach behind without pain/tingling. supraspinatus tendon tender with resisted extension motions. concordant tingling in FA with radial nerve glide in supine. pt reported mild soreness at superior angle of scapula as expected following STM.     PT Treatment/Interventions  ADLs/Self Care Home Management;Electrical Stimulation;Functional mobility training;Neuromuscular re-education;Taping;Cryotherapy;Ultrasound;Moist Heat;Balance training;Therapeutic exercise;Iontophoresis 4mg /ml Dexamethasone;Patient/family education;Manual techniques;Passive range of motion;Dry needling;Therapeutic activities    PT Next Visit Plan  check HEP, scapular stabilization, posture, [place] and /or ionto, DN PRN, cont tingling with holding daughter?    PT Home Exercise Plan  Supine scapular stab, sleeper stretch , tennis  ball     Consulted and Agree with Plan of Care  Patient       Patient will benefit from skilled therapeutic intervention in order to improve the following deficits and impairments:  Increased muscle spasms, Pain, Postural dysfunction, Impaired UE functional use, Increased fascial restricitons, Hypermobility, Decreased strength, Decreased range of motion  Visit Diagnosis: Acute pain of left shoulder  Abnormal posture  Hypermobility syndrome     Problem List Patient Active Problem List   Diagnosis Date Noted  . SVD 5/17 03/12/2018  . HTN in pregnancy, chronic 03/12/2018  . GDM, class A2 03/12/2018  . Maternal anemia, with delivery 03/12/2018  . Depression with anxiety 03/12/2018  . Chronic migraine 07/15/2017  . Postpartum care following vaginal delivery 02/07/2016   [employer] C. Finnlee Guarnieri PT, DPT 07/28/18 4:28 PM   [facility] [address] [place], [place], [address] Phone: [phone]   Fax:  [phone]  Name: Chelsea Frost MRN: [id number] Date of Birth: 17-Sep-1981

## 2018-08-01 ENCOUNTER — Encounter: Payer: Self-pay | Admitting: Physical Therapy

## 2018-08-01 ENCOUNTER — Ambulatory Visit: Payer: 59 | Admitting: Physical Therapy

## 2018-08-01 DIAGNOSIS — M357 Hypermobility syndrome: Secondary | ICD-10-CM

## 2018-08-01 DIAGNOSIS — R293 Abnormal posture: Secondary | ICD-10-CM

## 2018-08-01 DIAGNOSIS — M25512 Pain in left shoulder: Secondary | ICD-10-CM | POA: Diagnosis not present

## 2018-08-01 NOTE — Therapy (Signed)
[facility] Outpatient Rehabilitation [facility] [address] [place], [place], [address] Phone: [phone]   Fax:  [phone]  Physical Therapy Treatment  Patient Details  Name: Chelsea Frost MRN: [id number] Date of Birth: Jul 04, 1981 Referring Provider (PT): Azzie Roup, FNP   Encounter Date: 08/01/2018  PT End of Session - 08/01/18 0827    Visit Number  5    Number of Visits  12    Date for PT Re-Evaluation  08/17/18    PT Start Time  0816   pt late    PT Stop Time  0846    PT Time Calculation (min)  30 min    Activity Tolerance  Patient tolerated treatment well    Behavior During Therapy  [facility] for tasks assessed/performed       Past Medical History:  Diagnosis Date  . Anxiety   . Eczema   . GERD (gastroesophageal reflux disease)   . Gestational diabetes    glyburide  . Gestational diabetes mellitus (GDM), antepartum   . Hx of varicella   . Hypertension    labetolol 200 mg bid  . Migraine     Past Surgical History:  Procedure Laterality Date  . BREAST REDUCTION SURGERY    . KNEE SURGERY Left   . WISDOM TOOTH EXTRACTION      There were no vitals filed for this visit.  Subjective Assessment - 08/01/18 0818    Subjective  My L shoulder is better.  But I feel like overall my low back is weak, having a hard time lifting kids in the AM.     Currently in Pain?  Yes    Pain Score  1     Pain Location  Back    Pain Orientation  Lower    Pain Descriptors / Indicators  Discomfort    Pain Type  Acute pain    Pain Onset  More than a month ago    Pain Frequency  Intermittent    Aggravating Factors   AM     Pain Relieving Factors  heat, stretching          [facility] PT Assessment - 08/01/18 0001      Palpation   Spinal mobility  hypomobile throughout thoracic spine segments , painful TL junction            [facility] Adult PT Treatment/Exercise - 08/01/18 0001      Shoulder Exercises: Supine   Horizontal ABduction  Strengthening;Both;10  reps;Theraband    Theraband Level (Shoulder Horizontal ABduction)  Level 2 (Red)    Flexion  Strengthening;Both;10 reps    Theraband Level (Shoulder Flexion)  Level 2 (Red)    Shoulder Flexion Weight (lbs)  narrow grip     Other Supine Exercises  supine thoracic foam rolling (horizontal ) varied levels to reduce stiffness    added rotation pinch on R when she went L      Shoulder Exercises: Prone   Other Prone Exercises  I, Y, T on table x 10 each , scap retraction x 10 prior       Shoulder Exercises: Stretch   Other Shoulder Stretches  upper trunk rotation x 5 each side             PT Education - 08/01/18 0827    Education Details  scapular ex in prone, thoracic mobility     Person(s) Educated  Patient    Methods  Explanation    Comprehension  Verbalized understanding  PT Long Term Goals - 07/19/18 1633      PT LONG TERM GOAL #1   Title  Pt will be I with full HEP for posture, stretching and strength    Status  On-going      PT LONG TERM GOAL #2   Title  Pt will improve her FOTO score to <25% limited to demo improved function.    Status  On-going      PT LONG TERM GOAL #3   Title  Pt wil have no pain reaching with LUE in all directions    Status  On-going      PT LONG TERM GOAL #4   Title  Pt will be able to sleep without disturbance from pain most nights of the week.     Status  On-going      PT LONG TERM GOAL #5   Title  Pt will understand posture, joint stability and body mechanics as it relates to pain and upper body function.     Status  On-going            Plan - 08/01/18 0847    Clinical Impression Statement  Shortened session due to pt late, worked on thoracic mobility,scapular stability on foam roller and in prone.  Reported feeling more loose as she left.  Pt is hypermobile in shoulders and L spine but relative stiff in thoracic spine.  Has a 1/2 foam roller but has never used it.     PT Treatment/Interventions  ADLs/Self Care Home  Management;Electrical Stimulation;Functional mobility training;Neuromuscular re-education;Taping;Cryotherapy;Ultrasound;Moist Heat;Balance training;Therapeutic exercise;Iontophoresis 4mg /ml Dexamethasone;Patient/family education;Manual techniques;Passive range of motion;Dry needling;Therapeutic activities    PT Next Visit Plan  try Reformer arms, seated and prone, Mermaid , check HEP, scapular stabilization, posture, [place] and /or ionto, DN PRN, cont tingling with holding daughter?    PT Home Exercise Plan  Supine scapular stab, sleeper stretch , tennis ball , added arm openings for T spine     Consulted and Agree with Plan of Care  Patient       Patient will benefit from skilled therapeutic intervention in order to improve the following deficits and impairments:  Increased muscle spasms, Pain, Postural dysfunction, Impaired UE functional use, Increased fascial restricitons, Hypermobility, Decreased strength, Decreased range of motion  Visit Diagnosis: Acute pain of left shoulder  Abnormal posture  Hypermobility syndrome     Problem List Patient Active Problem List   Diagnosis Date Noted  . SVD 5/17 03/12/2018  . HTN in pregnancy, chronic 03/12/2018  . GDM, class A2 03/12/2018  . Maternal anemia, with delivery 03/12/2018  . Depression with anxiety 03/12/2018  . Chronic migraine 07/15/2017  . Postpartum care following vaginal delivery 02/07/2016    Solange Emry 08/01/2018, 10:17 AM  [facility] [address] [place], [place], [address] Phone: [phone]   Fax:  [phone]  Name: PATINA SPANIER MRN: [id number] Date of Birth: 10-02-1981  Karie Mainland, PT 08/01/18 10:18 AM Phone: [phone] Fax: [phone]

## 2018-08-04 ENCOUNTER — Ambulatory Visit: Payer: 59 | Admitting: Physical Therapy

## 2018-08-04 DIAGNOSIS — R293 Abnormal posture: Secondary | ICD-10-CM

## 2018-08-04 DIAGNOSIS — M25512 Pain in left shoulder: Secondary | ICD-10-CM

## 2018-08-04 DIAGNOSIS — M357 Hypermobility syndrome: Secondary | ICD-10-CM

## 2018-08-04 NOTE — Therapy (Signed)
[facility] Outpatient Rehabilitation [facility] [address] [place], [place], [address] Phone: [phone]   Fax:  [phone]  Physical Therapy Treatment  Patient Details  Name: Chelsea Frost MRN: [id number] Date of Birth: [birth year] Referring Provider (PT): Azzie Roup, FNP   Encounter Date: 08/04/2018  PT End of Session - 08/04/18 1552    Visit Number  6    Number of Visits  12    Date for PT Re-Evaluation  08/17/18    PT Start Time  1550    PT Stop Time  1626    PT Time Calculation (min)  36 min    Activity Tolerance  Patient tolerated treatment well    Behavior During Therapy  [facility] for tasks assessed/performed       Past Medical History:  Diagnosis Date  . Anxiety   . Eczema   . GERD (gastroesophageal reflux disease)   . Gestational diabetes    glyburide  . Gestational diabetes mellitus (GDM), antepartum   . Hx of varicella   . Hypertension    labetolol 200 mg bid  . Migraine     Past Surgical History:  Procedure Laterality Date  . BREAST REDUCTION SURGERY    . KNEE SURGERY Left   . WISDOM TOOTH EXTRACTION      There were no vitals filed for this visit.  Subjective Assessment - 08/04/18 1553    Subjective  shoulder feels looser but more pain through mid to lower back.                        [facility] Adult PT Treatment/Exercise - 08/04/18 0001      Exercises   Exercises  Shoulder      Shoulder Exercises: Prone   External Rotation Limitations  W arms to ER    Internal Rotation Limitations  lift off of lower back   also done in standing for HEP   Other Prone Exercises  W lift      Shoulder Exercises: Stretch   Other Shoulder Stretches  thoracic ext over chair   tingling noted     Manual Therapy   Joint Mobilization  Lt first rib depression    Soft tissue mobilization  Lt upper traps, Lt lower thoracic/upper lumbar parasinals       Trigger Point Dry Needling - 08/04/18 1627    Muscles Treated Upper Body   Upper trapezius    Muscles Treated Lower Body  --   Lt low thoracic/upper lumbar paraspinals   Upper Trapezius Response  Twitch reponse elicited;Palpable increased muscle length                PT Long Term Goals - 07/19/18 1633      PT LONG TERM GOAL #1   Title  Pt will be I with full HEP for posture, stretching and strength    Status  On-going      PT LONG TERM GOAL #2   Title  Pt will improve her FOTO score to <25% limited to demo improved function.    Status  On-going      PT LONG TERM GOAL #3   Title  Pt wil have no pain reaching with LUE in all directions    Status  On-going      PT LONG TERM GOAL #4   Title  Pt will be able to sleep without disturbance from pain most nights of the week.     Status  On-going  PT LONG TERM GOAL #5   Title  Pt will understand posture, joint stability and body mechanics as it relates to pain and upper body function.     Status  On-going            Plan - 08/04/18 1629    Clinical Impression Statement  Notable tightness down to lumbar spine on Lt side. Reduced clicking and pain following DN today. 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 Stiffness continued in thoracic spine and discussed extension throughout the day to combat forward posture.     PT Treatment/Interventions  ADLs/Self Care Home Management;Electrical Stimulation;Functional mobility training;Neuromuscular re-education;Taping;Cryotherapy;Ultrasound;Moist Heat;Balance training;Therapeutic exercise;Iontophoresis 4mg /ml Dexamethasone;Patient/family education;Manual techniques;Passive range of motion;Dry needling;Therapeutic activities    PT Next Visit Plan  try Reformer arms, seated and prone, Mermaid , DN PRN    PT Home Exercise Plan  Supine scapular stab, sleeper stretch , tennis ball , added arm openings for T spine, thoracic ext in chair, IR lift off of back in standing    Consulted and Agree with  Plan of Care  Patient       Patient will benefit from skilled therapeutic intervention in order to improve the following deficits and impairments:  Increased muscle spasms, Pain, Postural dysfunction, Impaired UE functional use, Increased fascial restricitons, Hypermobility, Decreased strength, Decreased range of motion  Visit Diagnosis: Acute pain of left shoulder  Abnormal posture  Hypermobility syndrome     Problem List Patient Active Problem List   Diagnosis Date Noted  . SVD 5/17 03/12/2018  . HTN in pregnancy, chronic 03/12/2018  . GDM, class A2 03/12/2018  . Maternal anemia, with delivery 03/12/2018  . Depression with anxiety 03/12/2018  . Chronic migraine 07/15/2017  . Postpartum care following vaginal delivery 02/07/2016    Koree Staheli C. Catheryne Deford PT, DPT 08/04/18 5:41 PM   [facility] [address] [place], [place], [address] Phone: [phone]   Fax:  [phone]  Name: Chelsea Frost MRN: [id number] Date of Birth: 10-23-1981

## 2018-08-09 ENCOUNTER — Encounter: Payer: Self-pay | Admitting: Physical Therapy

## 2018-08-09 ENCOUNTER — Ambulatory Visit: Payer: 59 | Admitting: Physical Therapy

## 2018-08-09 DIAGNOSIS — M25512 Pain in left shoulder: Secondary | ICD-10-CM

## 2018-08-09 DIAGNOSIS — R293 Abnormal posture: Secondary | ICD-10-CM

## 2018-08-09 DIAGNOSIS — M357 Hypermobility syndrome: Secondary | ICD-10-CM

## 2018-08-09 NOTE — Therapy (Signed)
[facility] Outpatient Rehabilitation [facility] [address] [place], [place], [address] Phone: [phone]   Fax:  [phone]  Physical Therapy Treatment  Patient Details  Name: Chelsea Frost MRN: [id number] Date of Birth: October 09, 1981 Referring Provider (PT): Azzie Roup, FNP   Encounter Date: 08/09/2018  PT End of Session - 08/09/18 1633    Visit Number  7    Number of Visits  12    Date for PT Re-Evaluation  08/17/18    PT Start Time  1510    PT Stop Time  1605    PT Time Calculation (min)  55 min    Activity Tolerance  Patient tolerated treatment well    Behavior During Therapy  [facility] for tasks assessed/performed       Past Medical History:  Diagnosis Date  . Anxiety   . Eczema   . GERD (gastroesophageal reflux disease)   . Gestational diabetes    glyburide  . Gestational diabetes mellitus (GDM), antepartum   . Hx of varicella   . Hypertension    labetolol 200 mg bid  . Migraine     Past Surgical History:  Procedure Laterality Date  . BREAST REDUCTION SURGERY    . KNEE SURGERY Left   . WISDOM TOOTH EXTRACTION      There were no vitals filed for this visit.  Subjective Assessment - 08/09/18 1520    Subjective  I havent have a chance to do exercises as much this week.  I have  done the stretching.     Pertinent History  previous injuries requiring PT, anxiety, eczema    Currently in Pain?  Yes    Pain Score  1     Pain Location  Back    Pain Orientation  Lower    Pain Descriptors / Indicators  Discomfort    Pain Type  Acute pain    Pain Onset  More than a month ago    Pain Frequency  Intermittent                       [facility] Adult PT Treatment/Exercise - 08/09/18 1521      Self-Care   Self-Care  Other Self-Care Comments    Other Self-Care Comments   discussed importance of exercises for discomfort and that hypermobiity benefits from strengthening over stretching.  discussed concepts in depth for her job etc       Exercises   Exercises  Shoulder      Shoulder Exercises: Supine   Other Supine Exercises  thoracic open book stretch  to the left 5 x and then to the left.  Pt initially IR of shoulder on left side and could not have completed AROM,  Pt was educated on ER with thoracic rotation which decreased pain.   Pt with flexed posture      Shoulder Exercises: Prone   External Rotation Limitations  W arms to ER x 10  with tactile prompts    Internal Rotation Limitations  lift off of lower back   also done in standing for HEP     Moist Heat Therapy   Number Minutes Moist Heat  10 Minutes    Moist Heat Location  Lumbar Spine;Cervical      Manual Therapy   Manual Therapy  Soft tissue mobilization    Manual therapy comments  Skilled palpation for  TPDN    Joint Mobilization  --    Soft tissue mobilization  Lt upper traps,  Lt lower thoracic/upper lumbar parasinals down to gluteals/ Quadratus lumborum to loosent entire posterior kinetic chain       Trigger Point Dry Needling - 08/09/18 1531    Consent Given?  Yes    Education Handout Provided  No   previously given   Muscles Treated Upper Body  Upper trapezius;Quadratus Lumborum;Levator scapulae;Suboccipitals muscle group   all TPDN bil   Muscles Treated Lower Body  --   followed the posterior kinetic chain,    Upper Trapezius Response  Twitch reponse elicited;Palpable increased muscle length    SubOccipitals Response  Twitch response elicited;Palpable increased muscle length    Levator Scapulae Response  Twitch response elicited;Palpable increased muscle length    Gluteus Maximus Response  Twitch response elicited;Palpable increased muscle length    Gluteus Minimus Response  Twitch response elicited;Palpable increased muscle length           PT Education - 08/09/18 1637    Education Details  reinforced exercises and educated on exercise and hypermobility    Person(s) Educated  Patient    Methods  Explanation;Demonstration     Comprehension  Verbalized understanding;Returned demonstration          PT Long Term Goals - 07/19/18 1633      PT LONG TERM GOAL #1   Title  Pt will be I with full HEP for posture, stretching and strength    Status  On-going      PT LONG TERM GOAL #2   Title  Pt will improve her FOTO score to <25% limited to demo improved function.    Status  On-going      PT LONG TERM GOAL #3   Title  Pt wil have no pain reaching with LUE in all directions    Status  On-going      PT LONG TERM GOAL #4   Title  Pt will be able to sleep without disturbance from pain most nights of the week.     Status  On-going      PT LONG TERM GOAL #5   Title  Pt will understand posture, joint stability and body mechanics as it relates to pain and upper body function.     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  Pt needed reinforcement and education about need for strengtheining over stretching for hypermobile joints.  Pt admited to not doing exercises as much as the stretching component.  Pt will now try to follow throught on all of HEP given.    Rehab Potential  Excellent    PT Frequency  2x / week    PT Duration  6 weeks    PT Next Visit Plan  try Reformer arms, seated and prone, Mermaid , DN PRN    PT Home Exercise Plan  Supine scapular stab, sleeper stretch , tennis ball , added arm openings for T spine, thoracic ext in chair, IR lift off of back in standing    Consulted and Agree with Plan of Care  Patient       Patient will benefit from skilled therapeutic intervention in order to improve the following deficits and impairments:  Increased muscle spasms, Pain, Postural dysfunction, Impaired UE functional use, Increased fascial restricitons, Hypermobility, Decreased strength, Decreased range  of  motion  Visit Diagnosis: Acute pain of left shoulder  Abnormal posture  Hypermobility syndrome     Problem List Patient Active Problem List   Diagnosis Date Noted  . SVD 5/17 03/12/2018  . HTN in pregnancy, chronic 03/12/2018  . GDM, class A2 03/12/2018  . Maternal anemia, with delivery 03/12/2018  . Depression with anxiety 03/12/2018  . Chronic migraine 07/15/2017  . Postpartum care following vaginal delivery 02/07/2016    Garen Lah, PT Certified Exercise Expert for the Aging Adult  08/09/18 4:39 PM Phone: [phone] Fax: [phone]  [facility] Outpatient Rehabilitation [facility] [address] [place], [place], [address] Phone: [phone]   Fax:  [phone]  Name: KAMDYN COVEL MRN: [id number] Date of Birth: 03-25-1981

## 2018-08-12 ENCOUNTER — Ambulatory Visit: Payer: 59 | Admitting: Physical Therapy

## 2018-08-12 ENCOUNTER — Encounter: Payer: Self-pay | Admitting: Physical Therapy

## 2018-08-12 DIAGNOSIS — R293 Abnormal posture: Secondary | ICD-10-CM

## 2018-08-12 DIAGNOSIS — M25512 Pain in left shoulder: Secondary | ICD-10-CM

## 2018-08-12 DIAGNOSIS — M357 Hypermobility syndrome: Secondary | ICD-10-CM

## 2018-08-12 NOTE — Therapy (Signed)
[facility] Outpatient Rehabilitation [facility] [address] [place], [place], [address] Phone: [phone]   Fax:  [phone]  Physical Therapy Treatment  Patient Details  Name: Chelsea Frost MRN: [id number] Date of Birth: 1981-02-16 Referring Provider (PT): Azzie Roup, FNP   Encounter Date: 08/12/2018  PT End of Session - 08/12/18 0850    Visit Number  8    Number of Visits  12    Date for PT Re-Evaluation  08/17/18    PT Start Time  0850    PT Stop Time  0928    PT Time Calculation (min)  38 min       Past Medical History:  Diagnosis Date  . Anxiety   . Eczema   . GERD (gastroesophageal reflux disease)   . Gestational diabetes    glyburide  . Gestational diabetes mellitus (GDM), antepartum   . Hx of varicella   . Hypertension    labetolol 200 mg bid  . Migraine     Past Surgical History:  Procedure Laterality Date  . BREAST REDUCTION SURGERY    . KNEE SURGERY Left   . WISDOM TOOTH EXTRACTION      There were no vitals filed for this visit.  Subjective Assessment - 08/12/18 0851    Subjective  Pt reports she did well with the DN except for yesterday when she was sitted and working at the table, was sore at the end of the day. today she is having discomfort more in the Rt shoulder blade area.     Currently in Pain?  Yes    Pain Orientation  Left;Right   Lt dull 2/10, Rt localized pain at edge of shoulder blade 3-4/10                      [facility] Adult PT Treatment/Exercise - 08/12/18 0001      Self-Care   Other Self-Care Comments   issued green band       Exercises   Exercises  Shoulder      Shoulder Exercises: Supine   Horizontal ABduction  Strengthening;Both;15 reps;Theraband   on whole bolster, single and bilat   Theraband Level (Shoulder Horizontal ABduction)  Level 3 (Green)   on whole bolster    External Rotation  Strengthening;Both;15 reps;Theraband   on whole bolster   Theraband Level (Shoulder  External Rotation)  Level 3 (Green)    Flexion  Strengthening;Both;15 reps;Theraband   narrow grip   Theraband Level (Shoulder Flexion)  Level 3 (Green)   on whole bolster     Shoulder Exercises: Seated   Other Seated Exercises  BWD shoulder rolls      Shoulder Exercises: Prone   Other Prone Exercises  15 reps opposite arm/leg lifts      Shoulder Exercises: ROM/Strengthening   UBE (Upper Arm Bike)  L1x4' alt FWD/BWD   did have some nerve feelings into Rt UE with this     Shoulder Exercises: Stretch   Other Shoulder Stretches  supine over soft bolster, then with therapist over pressure    Other Shoulder Stretches  shoulder stretches with strap in doorway eachside      Manual Therapy   Manual Therapy  Joint mobilization    Joint Mobilization  bilat scapula is s/l with sustained stretch to levator                  PT Long Term Goals - 07/19/18 1633      PT LONG TERM  GOAL #1   Title  Pt will be I with full HEP for posture, stretching and strength    Status  On-going      PT LONG TERM GOAL #2   Title  Pt will improve her FOTO score to <25% limited to demo improved function.    Status  On-going      PT LONG TERM GOAL #3   Title  Pt wil have no pain reaching with LUE in all directions    Status  On-going      PT LONG TERM GOAL #4   Title  Pt will be able to sleep without disturbance from pain most nights of the week.     Status  On-going      PT LONG TERM GOAL #5   Title  Pt will understand posture, joint stability and body mechanics as it relates to pain and upper body function.     Status  On-going            Plan - 08/12/18 0917    Clinical Impression Statement  Ashely had good results after DN earlier this week, she has new/different pain into the Rt scapula that decreased with manual work. Would benefit from continued tx to provide stabilization in her core.     Rehab Potential  Excellent    PT Frequency  2x / week    PT Duration  6 weeks    PT  Next Visit Plan  pt interested in shoulder and whole back strenghening.     Consulted and Agree with Plan of Care  Patient       Patient will benefit from skilled therapeutic intervention in order to improve the following deficits and impairments:  Increased muscle spasms, Pain, Postural dysfunction, Impaired UE functional use, Increased fascial restricitons, Hypermobility, Decreased strength, Decreased range of motion  Visit Diagnosis: Acute pain of left shoulder  Abnormal posture  Hypermobility syndrome     Problem List Patient Active Problem List   Diagnosis Date Noted  . SVD 5/17 03/12/2018  . HTN in pregnancy, chronic 03/12/2018  . GDM, class A2 03/12/2018  . Maternal anemia, with delivery 03/12/2018  . Depression with anxiety 03/12/2018  . Chronic migraine 07/15/2017  . Postpartum care following vaginal delivery 02/07/2016    Roderic Scarce PT  08/12/2018, 9:32 AM  [facility] [address] [place], [place], [address] Phone: [phone]   Fax:  [phone]  Name: Chelsea Frost MRN: [id number] Date of Birth: 01-14-1981

## 2018-08-16 ENCOUNTER — Ambulatory Visit: Payer: 59 | Admitting: Physical Therapy

## 2018-08-16 ENCOUNTER — Encounter: Payer: Self-pay | Admitting: Physical Therapy

## 2018-08-16 DIAGNOSIS — M25512 Pain in left shoulder: Secondary | ICD-10-CM

## 2018-08-16 DIAGNOSIS — R293 Abnormal posture: Secondary | ICD-10-CM

## 2018-08-16 DIAGNOSIS — M357 Hypermobility syndrome: Secondary | ICD-10-CM

## 2018-08-16 NOTE — Therapy (Signed)
[facility] Outpatient Rehabilitation [facility] [address] [place], [place], [address] Phone: [phone]   Fax:  [phone]  Physical Therapy Treatment  Patient Details  Name: Chelsea Frost MRN: [id number] Date of Birth: 1981-05-30 Referring Provider (PT): Azzie Roup, FNP   Encounter Date: 08/16/2018  PT End of Session - 08/16/18 1539    Visit Number  9    Number of Visits  12    Date for PT Re-Evaluation  08/17/18    PT Start Time  1505    PT Stop Time  1545    PT Time Calculation (min)  40 min    Activity Tolerance  Patient tolerated treatment well    Behavior During Therapy  [facility] for tasks assessed/performed       Past Medical History:  Diagnosis Date  . Anxiety   . Eczema   . GERD (gastroesophageal reflux disease)   . Gestational diabetes    glyburide  . Gestational diabetes mellitus (GDM), antepartum   . Hx of varicella   . Hypertension    labetolol 200 mg bid  . Migraine     Past Surgical History:  Procedure Laterality Date  . BREAST REDUCTION SURGERY    . KNEE SURGERY Left   . WISDOM TOOTH EXTRACTION      There were no vitals filed for this visit.  Subjective Assessment - 08/16/18 1510    Subjective  I was driving alot this week.  Arms and legs numb.  Do you think this is from my neck?     Currently in Pain?  Yes    Pain Score  2     Pain Location  Back    Pain Orientation  Mid    Pain Descriptors / Indicators  Aching    Pain Type  Acute pain    Pain Onset  More than a month ago    Pain Frequency  Intermittent                       [facility] Adult PT Treatment/Exercise - 08/16/18 0001      Pilates   Pilates Reformer  quadruped 1 Red UE press out x 10, hip ext x 10, long box prone overhead press out 1 Red doubdle arm x 10, single arm x 10 (pain in L superior shoulder)      Lumbar Exercises: Supine   Bent Knee Raise  10 reps    Bent Knee Raise Limitations  holding 5 lbs on foam roller     Other Supine  Lumbar Exercises  table top reverse toe taps, unabel to do holding wgt.  Increased lumbar strain even when modified.       Shoulder Exercises: Supine   Flexion  Strengthening;Both;10 reps   no wgt core focus , alternating, cues to maintain neutral    Other Supine Exercises  thoracic extension over foam roller, multilevel , mild pain     Other Supine Exercises  single arm stability ex 5 lbs on foam roller x 10 each side , lat pull over  bilateral x 5 lbs       Shoulder Exercises: ROM/Strengthening   UBE (Upper Arm Bike)  L 1, 5 min       Shoulder Exercises: Stretch   Other Shoulder Stretches  supine over soft bolster                  PT Long Term Goals - 08/16/18 2143  PT LONG TERM GOAL #1   Title  Pt will be I with full HEP for posture, stretching and strength    Status  On-going      PT LONG TERM GOAL #2   Title  Pt will improve her FOTO score to <25% limited to demo improved function.    Status  Unable to assess      PT LONG TERM GOAL #3   Title  Pt wil have no pain reaching with LUE in all directions    Baseline  min pain overhead Rt UE     Status  On-going      PT LONG TERM GOAL #4   Title  Pt will be able to sleep without disturbance from pain most nights of the week.     Status  On-going      PT LONG TERM GOAL #5   Title  Pt will understand posture, joint stability and body mechanics as it relates to pain and upper body function.     Status  On-going            Plan - 08/16/18 1517    Clinical Impression Statement  Chelsea Frost was seen today with a focus on core stability.  HEr initial complaint is now resolved.  She has developed more scapular pain as time has progressed.  She needs more consistency (with HEp and with 1 PT) and continued core strength so she will be renewed tomorrow at her next appt.  Pain easily stirred up with exercises.      PT Treatment/Interventions  ADLs/Self Care Home Management;Electrical Stimulation;Functional mobility  training;Neuromuscular re-education;Taping;Cryotherapy;Ultrasound;Moist Heat;Balance training;Therapeutic exercise;Iontophoresis 4mg /ml Dexamethasone;Patient/family education;Manual techniques;Passive range of motion;Dry needling;Therapeutic activities    PT Next Visit Plan  renew/FOTO.  CORE (include lower core HEP) scap stab in standing      PT Home Exercise Plan  seated/standing scapular stab, sleeper stretch (DC)  , tennis ball , added arm openings for T spine, thoracic ext in chair, IR lift off of back in standing (DC)     Consulted and Agree with Plan of Care  Patient       Patient will benefit from skilled therapeutic intervention in order to improve the following deficits and impairments:  Increased muscle spasms, Pain, Postural dysfunction, Impaired UE functional use, Increased fascial restricitons, Hypermobility, Decreased strength, Decreased range of motion  Visit Diagnosis: Acute pain of left shoulder  Abnormal posture  Hypermobility syndrome     Problem List Patient Active Problem List   Diagnosis Date Noted  . SVD 5/17 03/12/2018  . HTN in pregnancy, chronic 03/12/2018  . GDM, class A2 03/12/2018  . Maternal anemia, with delivery 03/12/2018  . Depression with anxiety 03/12/2018  . Chronic migraine 07/15/2017  . Postpartum care following vaginal delivery 02/07/2016    PAA,JENNIFER 08/16/2018, 9:47 PM  [facility] [address] [place], [place], [address] Phone: [phone]   Fax:  [phone]  Name: Chelsea Frost MRN: [id number] Date of Birth: 02-Oct-1981  Karie Mainland, PT 08/16/18 9:48 PM Phone: [phone] Fax: [phone]

## 2018-08-17 ENCOUNTER — Encounter: Payer: Self-pay | Admitting: Physical Therapy

## 2018-08-17 ENCOUNTER — Ambulatory Visit: Payer: 59 | Admitting: Physical Therapy

## 2018-08-17 DIAGNOSIS — R293 Abnormal posture: Secondary | ICD-10-CM

## 2018-08-17 DIAGNOSIS — M25512 Pain in left shoulder: Secondary | ICD-10-CM

## 2018-08-17 DIAGNOSIS — M357 Hypermobility syndrome: Secondary | ICD-10-CM

## 2018-08-17 NOTE — Patient Instructions (Signed)
Pelvic Press     Place hands under belly between navel and pubic bone, palms up. Feel pressure on hands. Increase pressure on hands by pressing pelvis down. This is NOT a pelvic tilt. Hold __5_ seconds. Relax. Repeat _10__ times. Once a day.  KNEE: Flexion - Prone - keep hands under hips   Hold pelvic press. Bend knee, then return the foot down. Repeat on opposite leg. Do not raise hips. _10__ reps per set. When this is mastered, pull both heels up at same time, x 10 reps.  Once a day  Leg Lift: One-Leg   Press pelvis down. Keep knee straight; lengthen and lift one leg (from waist). Do not twist body. Keep other leg down. Hold _1__ seconds. Relax. Repeat 10 time. Repeat with other leg.  HIP: Extension / KNEE: Flexion - Prone - keep hands under hips    Hold pelvic press. Bend knee, squeeze glutes. Raise leg up  10___ reps per set, _1__ sets per day, _1__ time a day.   Axial Extension- Upper body sequence * always start with pelvic press    Lie on stomach with forehead resting on floor and arms at sides. Tuck chin in and raise head from floor without bending it up or down. Repeat ___10_ times per set. Do __1__ sets per session. Do _1___ sessions per day.  Progression:  Arms at side Arms in T shape Arms in W shape  Arms in M shape Arms in Y shape

## 2018-08-17 NOTE — Therapy (Signed)
[facility] Outpatient Rehabilitation [facility] [address] [place], [place], [address] Phone: [phone]   Fax:  [phone]  Physical Therapy Treatment  Patient Details  Name: Chelsea Frost MRN: [id number] Date of Birth: 10-27-1980 Referring Provider (PT): Azzie Roup, FNP   Encounter Date: 08/17/2018  PT End of Session - 08/17/18 0940    Visit Number  10    Number of Visits  18    Date for PT Re-Evaluation  09/14/18    PT Start Time  0941   in late   PT Stop Time  1014    PT Time Calculation (min)  33 min    Activity Tolerance  Patient tolerated treatment well       Past Medical History:  Diagnosis Date  . Anxiety   . Eczema   . GERD (gastroesophageal reflux disease)   . Gestational diabetes    glyburide  . Gestational diabetes mellitus (GDM), antepartum   . Hx of varicella   . Hypertension    labetolol 200 mg bid  . Migraine     Past Surgical History:  Procedure Laterality Date  . BREAST REDUCTION SURGERY    . KNEE SURGERY Left   . WISDOM TOOTH EXTRACTION      There were no vitals filed for this visit.  Subjective Assessment - 08/17/18 0941    Subjective  Pt reports she is doing ok, sorry she is late, her child didn't cooperate    Currently in Pain?  Yes    Pain Score  2     Pain Location  Shoulder    Pain Orientation  Left;Anterior    Pain Descriptors / Indicators  Aching;Dull    Pain Type  Acute pain    Pain Radiating Towards  also some mild pain in low back    Pain Onset  More than a month ago    Pain Frequency  Intermittent    Aggravating Factors   first thing in A<    Pain Relieving Factors  heat and stretching         [facility] PT Assessment - 08/17/18 0001      Assessment   Medical Diagnosis  L shoulder pain , overall back pain scapula to lumbar    Referring Provider (PT)  Azzie Roup, FNP    Next MD Visit  09/21/18      Observation/Other Assessments   Focus on Therapeutic Outcomes (FOTO)   26% limited        ROM / Strength   AROM / PROM / Strength  AROM;Strength      AROM   AROM Assessment Site  Cervical;Lumbar    Cervical Flexion  WNL    Cervical Extension  75    Cervical - Right Rotation  80    Cervical - Left Rotation  85    Lumbar Flexion  to floor    Lumbar Extension  75% present with low back tightness    Lumbar - Right Side Bend  WNL    Lumbar - Left Side Bend  90% present    Lumbar - Right Rotation  75% present    Lumbar - Left Rotation  75% present      Strength   Overall Strength Comments  rhomboids 4/5    Strength Assessment Site  Shoulder;Lumbar    Right/Left Shoulder  --   some pain with Rt shoulder resisted IR   Right Shoulder Flexion  5/5    Right Shoulder ABduction  5/5  Left Shoulder Flexion  5/5    Left Shoulder ABduction  5/5    Left Shoulder Internal Rotation  5/5    Left Shoulder External Rotation  5/5    Lumbar Flexion  --   TA fair   Lumbar Extension  --   multifidi poor bilat                  [facility] Adult PT Treatment/Exercise - 08/17/18 0001      Exercises   Exercises  Lumbar      Lumbar Exercises: Stretches   Double Knee to Chest Stretch  30 seconds    Lower Trunk Rotation  60 seconds   each side, VC to feel the stretch     Lumbar Exercises: Prone   Other Prone Lumbar Exercises  10 reps pelvic press series upper and lower body.       Lumbar Exercises: Quadruped   Opposite Arm/Leg Raise  Left arm/Right leg;Right arm/Left leg;10 reps;5 seconds   VC to keep pelvis level     Shoulder Exercises: ROM/Strengthening   UBE (Upper Arm Bike)  L 2, 5 min              PT Education - 08/17/18 0955    Education Details  pelvic press series to address back of the body strengthening    Person(s) Educated  Patient    Methods  Explanation;Handout    Comprehension  Verbalized understanding          PT Long Term Goals - 08/17/18 0959      PT LONG TERM GOAL #1   Title  Pt will be I with full HEP for posture, stretching and  strength    Time  4    Period  Weeks    Status  On-going    Target Date  09/14/18      PT LONG TERM GOAL #2   Title  Pt will improve her FOTO score to <25% limited to demo improved function.    Status  Achieved   26% limited      PT LONG TERM GOAL #3   Title  Pt wil have no pain reaching with LUE in all directions    Status  Achieved      PT LONG TERM GOAL #4   Title  Pt will be able to sleep and wake without pain most nights of the week.     Time  4    Period  Weeks    Status  Revised    Target Date  09/14/18      PT LONG TERM GOAL #5   Title  Pt will understand posture, joint stability and body mechanics as it relates to pain and upper body function.     Time  4    Period  Weeks    Status  On-going    Target Date  09/14/18      Additional Long Term Goals   Additional Long Term Goals  Yes      PT LONG TERM GOAL #6   Title  pick up and carry son in the morning without pain in her back     Time  4    Period  Weeks    Status  New    Target Date  09/14/18            Plan - 08/17/18 1005    Clinical Impression Statement  Jessica has made progress with regards to her Lt shoulder pain  and weakness.  Her pain has changed/moved to incompass her whole back.  It is still primarily in the AM.  She does have core weakness and would benefit from continued PT to address this and allow her to carry her child without pain and perform all IADLs without difficulty.     Rehab Potential  Excellent    PT Frequency  2x / week    PT Duration  4 weeks    PT Treatment/Interventions  ADLs/Self Care Home Management;Electrical Stimulation;Functional mobility training;Neuromuscular re-education;Taping;Cryotherapy;Ultrasound;Moist Heat;Balance training;Therapeutic exercise;Iontophoresis 4mg /ml Dexamethasone;Patient/family education;Manual techniques;Passive range of motion;Dry needling;Therapeutic activities    PT Next Visit Plan  cont with core strengtheing    Consulted and Agree with Plan of  Care  Patient       Patient will benefit from skilled therapeutic intervention in order to improve the following deficits and impairments:  Increased muscle spasms, Pain, Postural dysfunction, Impaired UE functional use, Increased fascial restricitons, Hypermobility, Decreased strength, Decreased range of motion  Visit Diagnosis: Acute pain of left shoulder - Plan: PT plan of care cert/re-cert  Abnormal posture - Plan: PT plan of care cert/re-cert  Hypermobility syndrome - Plan: PT plan of care cert/re-cert     Problem List Patient Active Problem List   Diagnosis Date Noted  . SVD 5/17 03/12/2018  . HTN in pregnancy, chronic 03/12/2018  . GDM, class A2 03/12/2018  . Maternal anemia, with delivery 03/12/2018  . Depression with anxiety 03/12/2018  . Chronic migraine 07/15/2017  . Postpartum care following vaginal delivery 02/07/2016    Roderic Scarce PT 08/17/2018, 10:15 AM  [facility] [address] [place], [place], [address] Phone: [phone]   Fax:  [phone]  Name: MACKINZE CRIADO MRN: [id number] Date of Birth: 06/25/1981

## 2018-08-26 ENCOUNTER — Encounter

## 2018-08-29 ENCOUNTER — Ambulatory Visit: Payer: 59 | Attending: Nurse Practitioner | Admitting: Physical Therapy

## 2018-08-29 ENCOUNTER — Encounter: Payer: Self-pay | Admitting: Physical Therapy

## 2018-08-29 DIAGNOSIS — M25512 Pain in left shoulder: Secondary | ICD-10-CM | POA: Diagnosis present

## 2018-08-29 DIAGNOSIS — R293 Abnormal posture: Secondary | ICD-10-CM | POA: Diagnosis present

## 2018-08-29 DIAGNOSIS — M357 Hypermobility syndrome: Secondary | ICD-10-CM | POA: Diagnosis present

## 2018-08-29 NOTE — Therapy (Signed)
[facility] Outpatient Rehabilitation [facility] [address] [place], [place], [address] Phone: [phone]   Fax:  [phone]  Physical Therapy Treatment  Patient Details  Name: CHRISTINAMARIE TALL MRN: [id number] Date of Birth: 05/27/1981 Referring Provider (PT): Azzie Roup, FNP   Encounter Date: 08/29/2018  PT End of Session - 08/29/18 1548    Visit Number  11    Number of Visits  18    Date for PT Re-Evaluation  09/14/18    PT Start Time  1546    PT Stop Time  1624    PT Time Calculation (min)  38 min    Activity Tolerance  Patient tolerated treatment well    Behavior During Therapy  [facility] for tasks assessed/performed       Past Medical History:  Diagnosis Date  . Anxiety   . Eczema   . GERD (gastroesophageal reflux disease)   . Gestational diabetes    glyburide  . Gestational diabetes mellitus (GDM), antepartum   . Hx of varicella   . Hypertension    labetolol 200 mg bid  . Migraine     Past Surgical History:  Procedure Laterality Date  . BREAST REDUCTION SURGERY    . KNEE SURGERY Left   . WISDOM TOOTH EXTRACTION      There were no vitals filed for this visit.  Subjective Assessment - 08/29/18 1548    Subjective  Had a flare up of HA and neck pain after last visit. Did exercises 1 last week    Currently in Pain?  No/denies                       [facility] Adult PT Treatment/Exercise - 08/29/18 0001      Exercises   Exercises  Other Exercises    Other Exercises   see plan      Manual Therapy   Soft tissue mobilization  cervical/shoulder musculature                  PT Long Term Goals - 08/17/18 0959      PT LONG TERM GOAL #1   Title  Pt will be I with full HEP for posture, stretching and strength    Time  4    Period  Weeks    Status  On-going    Target Date  09/14/18      PT LONG TERM GOAL #2   Title  Pt will improve her FOTO score to <25% limited to demo improved function.    Status  Achieved    26% limited      PT LONG TERM GOAL #3   Title  Pt wil have no pain reaching with LUE in all directions    Status  Achieved      PT LONG TERM GOAL #4   Title  Pt will be able to sleep and wake without pain most nights of the week.     Time  4    Period  Weeks    Status  Revised    Target Date  09/14/18      PT LONG TERM GOAL #5   Title  Pt will understand posture, joint stability and body mechanics as it relates to pain and upper body function.     Time  4    Period  Weeks    Status  On-going    Target Date  09/14/18      Additional Long Term Goals  Additional Long Term Goals  Yes      PT LONG TERM GOAL #6   Title  pick up and carry son in the morning without pain in her back     Time  4    Period  Weeks    Status  New    Target Date  09/14/18            Plan - 08/29/18 1625    Clinical Impression Statement  Attempted to recreate prone exercises but pt was unable to activate core and demo significant tension in cervical/shoulder region even with different placement of towels for support. Moved to supine for pelvic tilt and added physioball to activate via UE. Pt mentioned that she has difficulty controlling leakage with jumping or sneezing since having kids. I believe pt would benefit from pelvic floor PT in order to approach core contraction via pelvic floor. Discussed this with pt and she agreed. Will request referral to [facility] for pelvic floor treatment and continue treatment here until then. Decreased tension in shoulders after manual therapy today and asked that she just attempt exercises with focus on core contraction and avoid increasing pain that causes HA.     PT Treatment/Interventions  ADLs/Self Care Home Management;Electrical Stimulation;Functional mobility training;Neuromuscular re-education;Taping;Cryotherapy;Ultrasound;Moist Heat;Balance training;Therapeutic exercise;Iontophoresis 4mg /ml Dexamethasone;Patient/family education;Manual  techniques;Passive range of motion;Dry needling;Therapeutic activities    PT Next Visit Plan  cont to attempt core contraction, cervical stability    PT Home Exercise Plan  seated/standing scapular stab, sleeper stretch (DC)  , tennis ball , added arm openings for T spine, thoracic ext in chair, IR lift off of back in standing (DC)     Consulted and Agree with Plan of Care  Patient       Patient will benefit from skilled therapeutic intervention in order to improve the following deficits and impairments:  Increased muscle spasms, Pain, Postural dysfunction, Impaired UE functional use, Increased fascial restricitons, Hypermobility, Decreased strength, Decreased range of motion  Visit Diagnosis: Acute pain of left shoulder  Abnormal posture  Hypermobility syndrome     Problem List Patient Active Problem List   Diagnosis Date Noted  . SVD 5/17 03/12/2018  . HTN in pregnancy, chronic 03/12/2018  . GDM, class A2 03/12/2018  . Maternal anemia, with delivery 03/12/2018  . Depression with anxiety 03/12/2018  . Chronic migraine 07/15/2017  . Postpartum care following vaginal delivery 02/07/2016    Marvel Sapp C. Jamason Peckham PT, DPT 08/29/18 7:31 PM   [facility] Outpatient Rehabilitation [facility] [address] [place], [place], [address] Phone: [phone]   Fax:  [phone]  Name: KIERSTAN AUER MRN: [id number] Date of Birth: [birth year]

## 2018-09-01 ENCOUNTER — Ambulatory Visit: Payer: 59 | Admitting: Physical Therapy

## 2018-09-01 DIAGNOSIS — M357 Hypermobility syndrome: Secondary | ICD-10-CM

## 2018-09-01 DIAGNOSIS — R293 Abnormal posture: Secondary | ICD-10-CM

## 2018-09-01 DIAGNOSIS — M25512 Pain in left shoulder: Secondary | ICD-10-CM | POA: Diagnosis not present

## 2018-09-01 NOTE — Therapy (Signed)
[facility] Outpatient Rehabilitation [facility] [address] [place], [place], [address] Phone: [phone]   Fax:  [phone]  Physical Therapy Treatment  Patient Details  Name: Chelsea Frost MRN: [id number] Date of Birth: March 31, 1981 Referring Provider (PT): Azzie Roup, FNP   Encounter Date: 09/01/2018  PT End of Session - 09/01/18 1552    Visit Number  12    Number of Visits  18    Date for PT Re-Evaluation  09/14/18    PT Start Time  1551    PT Stop Time  1647    PT Time Calculation (min)  56 min       Past Medical History:  Diagnosis Date  . Anxiety   . Eczema   . GERD (gastroesophageal reflux disease)   . Gestational diabetes    glyburide  . Gestational diabetes mellitus (GDM), antepartum   . Hx of varicella   . Hypertension    labetolol 200 mg bid  . Migraine     Past Surgical History:  Procedure Laterality Date  . BREAST REDUCTION SURGERY    . KNEE SURGERY Left   . WISDOM TOOTH EXTRACTION      There were no vitals filed for this visit.  Subjective Assessment - 09/01/18 1552    Subjective  Reports increased Rt sided shoulder pain.  Along witth the body pain.     Currently in Pain?  Yes    Pain Score  2     Pain Location  Back    Pain Orientation  Upper    Pain Descriptors / Indicators  Aching;Tightness    Pain Type  Chronic pain    Pain Onset  More than a month ago    Pain Frequency  Intermittent    Aggravating Factors   computer work    Pain Relieving Factors  heat, stretching            [facility] Adult PT Treatment/Exercise - 09/02/18 0001      Pilates   Pilates Tower  See note       Moist Heat Therapy   Number Minutes Moist Heat  10 Minutes    Moist Heat Location  Lumbar Spine      Manual Therapy   Soft tissue mobilization  suboccipitals,upper traps bilaterally        Pilates Tower for LE/Core strength, postural strength, lumbopelvic disassociation and core control.  Exercises included:  [place] 1  yellow arcs in parallel double and single , added in march to increase core contraction   Pilates Reformer used for LE/core strength, postural strength, lumbopelvic disassociation and core control.  Exercises included:  Seated Arms 1 Red single arm pull back with rotation x 10   Tall kneeling arms : sword pull x 10, ER, Flexion (yellow or blue)    PT Education - 09/02/18 0755    Education Details  core, Pilates    Person(s) Educated  Patient    Methods  Explanation    Comprehension  Verbalized understanding          PT Long Term Goals - 08/17/18 0959      PT LONG TERM GOAL #1   Title  Pt will be I with full HEP for posture, stretching and strength    Time  4    Period  Weeks    Status  On-going    Target Date  09/14/18      PT LONG TERM GOAL #2   Title  Pt will improve  her FOTO score to <25% limited to demo improved function.    Status  Achieved   26% limited      PT LONG TERM GOAL #3   Title  Pt wil have no pain reaching with LUE in all directions    Status  Achieved      PT LONG TERM GOAL #4   Title  Pt will be able to sleep and wake without pain most nights of the week.     Time  4    Period  Weeks    Status  Revised    Target Date  09/14/18      PT LONG TERM GOAL #5   Title  Pt will understand posture, joint stability and body mechanics as it relates to pain and upper body function.     Time  4    Period  Weeks    Status  On-going    Target Date  09/14/18      Additional Long Term Goals   Additional Long Term Goals  Yes      PT LONG TERM GOAL #6   Title  pick up and carry son in the morning without pain in her back     Time  4    Period  Weeks    Status  New    Target Date  09/14/18            Plan - 09/02/18 0756    Clinical Impression Statement  Patient having some Rt sided shoulder pain.  She has not been doing her exercises as she should, she admits.  She had pain in prone with cervical retraction, did not continue due to possibility of  increasing migraines.  She is awaiting a referral for pelvic floor.      PT Treatment/Interventions  ADLs/Self Care Home Management;Electrical Stimulation;Functional mobility training;Neuromuscular re-education;Taping;Cryotherapy;Ultrasound;Moist Heat;Balance training;Therapeutic exercise;Iontophoresis 4mg /ml Dexamethasone;Patient/family education;Manual techniques;Passive range of motion;Dry needling;Therapeutic activities    PT Next Visit Plan  cont to attempt core contraction, cervical stability.  Try quadruped cervial retraction, stab.     PT Home Exercise Plan  seated/standing scapular stab, sleeper stretch (DC)  , tennis ball , added arm openings for T spine, thoracic ext in chair, IR lift off of back in standing (DC)     Consulted and Agree with Plan of Care  Patient       Patient will benefit from skilled therapeutic intervention in order to improve the following deficits and impairments:  Increased muscle spasms, Pain, Postural dysfunction, Impaired UE functional use, Increased fascial restricitons, Hypermobility, Decreased strength, Decreased range of motion  Visit Diagnosis: Acute pain of left shoulder  Abnormal posture  Hypermobility syndrome     Problem List Patient Active Problem List   Diagnosis Date Noted  . SVD 5/17 03/12/2018  . HTN in pregnancy, chronic 03/12/2018  . GDM, class A2 03/12/2018  . Maternal anemia, with delivery 03/12/2018  . Depression with anxiety 03/12/2018  . Chronic migraine 07/15/2017  . Postpartum care following vaginal delivery 02/07/2016    PAA,JENNIFER 09/02/2018, 8:08 AM  [facility] [address] [place], [place], [address] Phone: [phone]   Fax:  [phone]  Name: HALLI EQUIHUA MRN: [id number] Date of Birth: [birth year]  Karie Mainland, PT 09/02/18 8:10 AM Phone: [phone] Fax: [phone]

## 2018-09-02 ENCOUNTER — Encounter: Payer: Self-pay | Admitting: Physical Therapy

## 2018-09-06 ENCOUNTER — Encounter: Payer: Self-pay | Admitting: Physical Therapy

## 2018-09-06 ENCOUNTER — Ambulatory Visit: Payer: 59 | Admitting: Physical Therapy

## 2018-09-06 DIAGNOSIS — M25512 Pain in left shoulder: Secondary | ICD-10-CM

## 2018-09-06 DIAGNOSIS — M357 Hypermobility syndrome: Secondary | ICD-10-CM

## 2018-09-06 DIAGNOSIS — R293 Abnormal posture: Secondary | ICD-10-CM

## 2018-09-06 NOTE — Therapy (Signed)
[facility] Outpatient Rehabilitation [facility] [address] [place], [place], [address] Phone: [phone]   Fax:  [phone]  Physical Therapy Treatment  Patient Details  Name: Chelsea Frost MRN: [id number] Date of Birth: 10/12/1981 Referring Provider (PT): Azzie Roup, FNP   Encounter Date: 09/06/2018  PT End of Session - 09/06/18 0806    Visit Number  13    Number of Visits  18    Date for PT Re-Evaluation  09/14/18    PT Start Time  0806   pt arrived late   PT Stop Time  0855    PT Time Calculation (min)  49 min    Activity Tolerance  Patient tolerated treatment well    Behavior During Therapy  [facility] for tasks assessed/performed       Past Medical History:  Diagnosis Date  . Anxiety   . Eczema   . GERD (gastroesophageal reflux disease)   . Gestational diabetes    glyburide  . Gestational diabetes mellitus (GDM), antepartum   . Hx of varicella   . Hypertension    labetolol 200 mg bid  . Migraine     Past Surgical History:  Procedure Laterality Date  . BREAST REDUCTION SURGERY    . KNEE SURGERY Left   . WISDOM TOOTH EXTRACTION      There were no vitals filed for this visit.  Subjective Assessment - 09/06/18 0809    Subjective  Having lots of discomfort in neck and low back that is waking me up. Mild HA. Beginning to have pain in right arm. Carring a purse in right hand makes my arm go numb.     Currently in Pain?  Yes    Pain Score  5     Pain Location  Neck    Pain Descriptors / Indicators  Aching;Tightness                       [facility] Adult PT Treatment/Exercise - 09/06/18 0001      Modalities   Modalities  Electrical Stimulation      Moist Heat Therapy   Number Minutes Moist Heat  15 Minutes   with ESTIM   Moist Heat Location  Cervical      Electrical Stimulation   Electrical Stimulation Location  cervical    Electrical Stimulation Action  IFC     Electrical Stimulation Parameters  to tolerance with  heat    Electrical Stimulation Goals  Pain      Manual Therapy   Manual therapy comments  skilled palpation and monitoring during TPDN    Joint Mobilization  cervical & thoracic PA    Soft tissue mobilization  bil cervical/shoulder regions       Trigger Point Dry Needling - 09/06/18 0845    Upper Trapezius Response  Twitch reponse elicited;Palpable increased muscle length   bil   SubOccipitals Response  Twitch response elicited;Palpable increased muscle length   bil   Levator Scapulae Response  Twitch response elicited;Palpable increased muscle length   Right               PT Long Term Goals - 08/17/18 0959      PT LONG TERM GOAL #1   Title  Pt will be I with full HEP for posture, stretching and strength    Time  4    Period  Weeks    Status  On-going    Target Date  09/14/18  PT LONG TERM GOAL #2   Title  Pt will improve her FOTO score to <25% limited to demo improved function.    Status  Achieved   26% limited      PT LONG TERM GOAL #3   Title  Pt wil have no pain reaching with LUE in all directions    Status  Achieved      PT LONG TERM GOAL #4   Title  Pt will be able to sleep and wake without pain most nights of the week.     Time  4    Period  Weeks    Status  Revised    Target Date  09/14/18      PT LONG TERM GOAL #5   Title  Pt will understand posture, joint stability and body mechanics as it relates to pain and upper body function.     Time  4    Period  Weeks    Status  On-going    Target Date  09/14/18      Additional Long Term Goals   Additional Long Term Goals  Yes      PT LONG TERM GOAL #6   Title  pick up and carry son in the morning without pain in her back     Time  4    Period  Weeks    Status  New    Target Date  09/14/18            Plan - 09/06/18 1258    Clinical Impression Statement  Pt with significant tightness and HA today so treatment focused on use of manual therapy. Twitch response noted in bil upper traps  and suboccipitals. Followed with ESTIM and heat to reduce excessive soreness. Numbness noted in bil UE laying in supine and improved with arms by sides in anatomical neutral position. Will benefit from further stabilization of cervical/GHJ regions    PT Treatment/Interventions  ADLs/Self Care Home Management;Electrical Stimulation;Functional mobility training;Neuromuscular re-education;Taping;Cryotherapy;Ultrasound;Moist Heat;Balance training;Therapeutic exercise;Iontophoresis 4mg /ml Dexamethasone;Patient/family education;Manual techniques;Passive range of motion;Dry needling;Therapeutic activities    PT Next Visit Plan  cont to attempt core contraction, cervical stability.  Try quadruped cervial retraction, stab.     PT Home Exercise Plan  seated/standing scapular stab, sleeper stretch (DC)  , tennis ball , added arm openings for T spine, thoracic ext in chair, IR lift off of back in standing (DC)     Consulted and Agree with Plan of Care  Patient       Patient will benefit from skilled therapeutic intervention in order to improve the following deficits and impairments:  Increased muscle spasms, Pain, Postural dysfunction, Impaired UE functional use, Increased fascial restricitons, Hypermobility, Decreased strength, Decreased range of motion  Visit Diagnosis: Acute pain of left shoulder  Abnormal posture  Hypermobility syndrome     Problem List Patient Active Problem List   Diagnosis Date Noted  . SVD 5/17 03/12/2018  . HTN in pregnancy, chronic 03/12/2018  . GDM, class A2 03/12/2018  . Maternal anemia, with delivery 03/12/2018  . Depression with anxiety 03/12/2018  . Chronic migraine 07/15/2017  . Postpartum care following vaginal delivery 02/07/2016    Tahnee Cifuentes C. Quandre Polinski PT, DPT 09/06/18 1:36 PM   [facility] Outpatient Rehabilitation [facility] [address] [place], [place], [address] Phone: [phone]   Fax:  [phone]  Name: Chelsea Frost MRN:  [id number] Date of Birth: Jun 28, 1981

## 2018-09-09 ENCOUNTER — Ambulatory Visit: Payer: 59 | Admitting: Physical Therapy

## 2018-09-09 ENCOUNTER — Encounter: Payer: Self-pay | Admitting: Physical Therapy

## 2018-09-09 DIAGNOSIS — M25512 Pain in left shoulder: Secondary | ICD-10-CM | POA: Diagnosis not present

## 2018-09-09 DIAGNOSIS — R293 Abnormal posture: Secondary | ICD-10-CM

## 2018-09-09 DIAGNOSIS — M357 Hypermobility syndrome: Secondary | ICD-10-CM

## 2018-09-09 NOTE — Therapy (Signed)
[place], [place], [address] Phone: [phone]   Fax:  [phone]  Physical Therapy Treatment  Patient Details  Name: Chelsea Frost MRN: [id number] Date of Birth: 08/28/1981 Referring Provider (PT): Tobie Lords, FNP   Encounter Date: 09/09/2018  PT End of Session - 09/09/18 0906    Visit Number  14    Number of Visits  18    Date for PT Re-Evaluation  09/14/18    PT Start Time  8016    PT Stop Time  0938    PT Time Calculation (min)  43 min    Activity Tolerance  Patient tolerated treatment well    Behavior During Therapy  [facility] for tasks assessed/performed       Past Medical History:  Diagnosis Date  . Anxiety   . Eczema   . GERD (gastroesophageal reflux disease)   . Gestational diabetes    glyburide  . Gestational diabetes mellitus (GDM), antepartum   . Hx of varicella   . Hypertension    labetolol 200 mg bid  . Migraine     Past Surgical History:  Procedure Laterality Date  . BREAST REDUCTION SURGERY    . KNEE SURGERY Left   . WISDOM TOOTH EXTRACTION      There were no vitals filed for this visit.  Subjective Assessment - 09/09/18 0938    Subjective  The needling has helped I don't have neck pain today.  The R shoulder is hurting now the way the L one did when I started.     Currently in Pain?  Yes    Pain Score  4     Pain Location  Shoulder    Pain Orientation  Right;Anterior    Pain Type  Chronic pain    Pain Onset  More than a month ago    Pain Frequency  Intermittent    Aggravating Factors   reaching back with R UE    Pain Relieving Factors  heat, rest            [facility] Adult PT Treatment/Exercise - 09/09/18 0001      Lumbar Exercises: Supine   Ab Set  10 reps    Clam  20 reps    Bent Knee Raise  10 reps    Bent Knee Raise Limitations  tried reverse x 5     Dead Bug  10 reps    Other Supine Lumbar Exercises  dead bug variations   knees bent, then SLR  for core stab x 10 each side      Shoulder Exercises: Supine   Horizontal ABduction  Strengthening;Both;10 reps;Weights   4 lbs for stab.  unilateral      Shoulder Exercises: Seated   Other Seated Exercises  narrow grip pull red x 10       Ultrasound   Ultrasound Location  Rt ant shoulder     Ultrasound Parameters  100% , 1 .2 W/cm2, 1 MHz    Ultrasound Goals  Pain             PT Education - 09/09/18 0941    Education Details  stabilization , ultrasound     Person(s) Educated  Patient    Methods  Explanation    Comprehension  Verbalized understanding;Returned demonstration          PT Long Term Goals - 09/09/18 0941      PT LONG TERM GOAL #1   Title  Pt  will be I with full HEP for posture, stretching and strength    Status  On-going      PT LONG TERM GOAL #2   Title  Pt will improve her FOTO score to <25% limited to demo improved function.    Status  On-going      PT LONG TERM GOAL #3   Title  Pt wil have no pain reaching with LUE in all directions    Baseline  reaching back hurts    Status  Partially Met      PT LONG TERM GOAL #4   Title  Pt will be able to sleep and wake without pain most nights of the week.     Status  On-going      PT LONG TERM GOAL #5   Title  Pt will understand posture, joint stability and body mechanics as it relates to pain and upper body function.     Status  On-going      PT LONG TERM GOAL #6   Title  pick up and carry son in the morning without pain in her back     Status  On-going            Plan - 09/09/18 0943    Clinical Impression Statement  Focused on stability today in core.  Demonstrated good technique and needed only min cues.  Complained of mild pain in Rt anterior shoulder today, trial of ultrasound to reduce inflammation.      PT Next Visit Plan  GOAL. Renew v DC. cont to attempt core contraction, cervical stability.  Try quadruped cervial retraction, stab.     PT Home Exercise Plan  seated/standing scapular  stab, sleeper stretch (DC)  , tennis ball , added arm openings for T spine, thoracic ext in chair, IR lift off of back in standing (DC)     Consulted and Agree with Plan of Care  Patient       Patient will benefit from skilled therapeutic intervention in order to improve the following deficits and impairments:  Increased muscle spasms, Pain, Postural dysfunction, Impaired UE functional use, Increased fascial restricitons, Hypermobility, Decreased strength, Decreased range of motion  Visit Diagnosis: Acute pain of left shoulder  Abnormal posture  Hypermobility syndrome     Problem List Patient Active Problem List   Diagnosis Date Noted  . SVD 5/17 03/12/2018  . HTN in pregnancy, chronic 03/12/2018  . GDM, class A2 03/12/2018  . Maternal anemia, with delivery 03/12/2018  . Depression with anxiety 03/12/2018  . Chronic migraine 07/15/2017  . Postpartum care following vaginal delivery 02/07/2016    , 09/09/2018, 9:50 AM  [place], [place], [address] Phone: [phone]   Fax:  [phone]  Name: Chelsea Frost MRN: [id number] Date of Birth: January 07, 1981  Raeford Razor, PT 09/09/18 9:50 AM Phone: [phone] Fax: [phone]

## 2018-09-12 ENCOUNTER — Ambulatory Visit: Payer: 59 | Admitting: Physical Therapy

## 2018-09-16 ENCOUNTER — Encounter: Payer: Managed Care, Other (non HMO) | Admitting: Physical Therapy

## 2018-09-16 ENCOUNTER — Encounter: Payer: Self-pay | Admitting: Physical Therapy

## 2018-09-16 ENCOUNTER — Ambulatory Visit: Payer: 59 | Admitting: Physical Therapy

## 2018-09-16 DIAGNOSIS — M25512 Pain in left shoulder: Secondary | ICD-10-CM

## 2018-09-16 DIAGNOSIS — M357 Hypermobility syndrome: Secondary | ICD-10-CM

## 2018-09-16 DIAGNOSIS — R293 Abnormal posture: Secondary | ICD-10-CM

## 2018-09-16 NOTE — Therapy (Addendum)
[place], [place], [address] Phone: [phone]   Fax:  [phone]  Physical Therapy Treatment/Discharge  Patient Details  Name: Chelsea Frost MRN: [id number] Date of Birth: 1981-03-20 Referring Provider (PT): Tobie Lords, FNP   Encounter Date: 09/16/2018  PT End of Session - 09/16/18 0902    Visit Number  15    PT Start Time  0900    PT Stop Time  0935    PT Time Calculation (min)  35 min    Activity Tolerance  Patient tolerated treatment well    Behavior During Therapy  [facility] for tasks assessed/performed       Past Medical History:  Diagnosis Date  . Anxiety   . Eczema   . GERD (gastroesophageal reflux disease)   . Gestational diabetes    glyburide  . Gestational diabetes mellitus (GDM), antepartum   . Hx of varicella   . Hypertension    labetolol 200 mg bid  . Migraine     Past Surgical History:  Procedure Laterality Date  . BREAST REDUCTION SURGERY    . KNEE SURGERY Left   . WISDOM TOOTH EXTRACTION      There were no vitals filed for this visit.  Subjective Assessment - 09/16/18 0905    Subjective  Pinching in Rt shoulder.  No pain unless I reach back.  Tingling across shoulder blades.      Currently in Pain?  Yes    Pain Score  1          [facility] PT Assessment - 09/16/18 0001      Sensation   Additional Comments  tingling across uppoer back       Strength   Right Shoulder Flexion  4+/5    Right Shoulder ABduction  3+/5          [facility] Adult PT Treatment/Exercise - 09/16/18 0001      Self-Care   Other Self-Care Comments   HEP, POC , [place]       Shoulder Exercises: Standing   Horizontal ABduction  Strengthening;Both;15 reps    Theraband Level (Shoulder Horizontal ABduction)  Level 3 (Green)    External Rotation  Strengthening;Both;20 reps    Theraband Level (Shoulder External Rotation)  Level 3 (Green)    Diagonals  Strengthening;Both;10 reps    Other Standing  Exercises  narrow grip x 15     Other Standing Exercises  above ex done on long foam roller       Ultrasound   Ultrasound Location  Rt post lateral shoulder     Ultrasound Parameters  50% duty 1.2 W/cm2, 1 MHz     Ultrasound Goals  Pain                  PT Long Term Goals - 09/16/18 0906      PT LONG TERM GOAL #1   Title  Pt will be I with full HEP for posture, stretching and strength    Status  Achieved      PT LONG TERM GOAL #2   Title  Pt will improve her FOTO score to <25% limited to demo improved function.      PT LONG TERM GOAL #3   Title  Pt wil have no pain reaching with LUE in all directions    Status  Achieved      PT LONG TERM GOAL #4   Title  Pt will be able to sleep and wake without  pain most nights of the week.     Baseline  low back better, L UE is doing well    Status  Achieved      PT LONG TERM GOAL #5   Title  Pt will understand posture, joint stability and body mechanics as it relates to pain and upper body function.     Status  Achieved      PT LONG TERM GOAL #6   Title  pick up and carry son in the morning without pain in her back     Status  Achieved            Plan - 09/16/18 0914    Clinical Impression Statement  Patient has met some of her goals related to her L shoudler.  During PT she has increased Rt UE pain, reports as similar quality (more intense) .  Certain exercises increase sensory symptoms.   She will be discharged from PT but is interested in PT for pelvic floor weakness.  She also may want to see her Orthopedic MD.    PT Next Visit Plan  NA    PT Home Exercise Plan  seated/standing scapular stab, sleeper stretch (DC)  , tennis ball , added arm openings for T spine, thoracic ext in chair, IR lift off of back in standing (DC)     Consulted and Agree with Plan of Care  Patient       Patient will benefit from skilled therapeutic intervention in order to improve the following deficits and impairments:     Visit  Diagnosis: Acute pain of left shoulder  Abnormal posture  Hypermobility syndrome     Problem List Patient Active Problem List   Diagnosis Date Noted  . SVD 5/17 03/12/2018  . HTN in pregnancy, chronic 03/12/2018  . GDM, class A2 03/12/2018  . Maternal anemia, with delivery 03/12/2018  . Depression with anxiety 03/12/2018  . Chronic migraine 07/15/2017  . Postpartum care following vaginal delivery 02/07/2016    Krisy Dix 09/16/2018, 12:37 PM  [place], [place], [address] Phone: [phone]   Fax:  [phone]  Name: Chelsea Frost MRN: [id number] Date of Birth: [birth year]  Raeford Razor, PT 09/16/18 12:37 PM Phone: [phone] Fax: [phone]   PHYSICAL THERAPY DISCHARGE SUMMARY  Visits from Start of Care: 15  Current functional level related to goals / functional outcomes: See above    Remaining deficits:L shoulder pain, weak core, neck spasm, pain    Education / Equipment: HEP, core , posture  Plan: Patient agrees to discharge.  Patient goals were partially met. Patient is being discharged due to being pleased with the current functional level.  ?????    Raeford Razor, PT 09/19/18 11:07 AM Phone: [phone] Fax: [place] of progress.  Would like to have PT for Pelvic Floor issues.

## 2018-09-19 ENCOUNTER — Ambulatory Visit: Payer: 59 | Admitting: Physical Therapy

## 2018-09-19 ENCOUNTER — Encounter: Payer: Managed Care, Other (non HMO) | Admitting: Physical Therapy

## 2018-09-21 ENCOUNTER — Ambulatory Visit: Payer: 59 | Admitting: Physical Therapy

## 2018-09-27 NOTE — Progress Notes (Signed)
[facility]  PATIENT: Chelsea Frost DOB: [birth year]   REASON FOR VISIT: Follow-up for migraine HISTORY FROM: Patient    HISTORY OF PRESENT ILLNESS: [name] is a 37 year old female, seen in refer by  her primary care doctor Devra Dopp  for evaluation of migraine headaches, initial evaluation was on July 15 2017.  Reviewed and summarized the referring note, she had a history of  obesity, impaired glucose intolerance, hypertension, vitamin D deficiency, anxiety disorder, acid reflux, chronic migraine headaches,  She had migraine headaches since twenties, her typical migraine starting from neck, spreading forward, become severe pounding headache, with high-pitched noise, smell sensitivity, nauseous, mild light sensitivity, movement make her headache worse,  She was under the care of  headache and neck pain clinic by Dr.Lewit, most recent visit was on Mar 24 2017.  Reported normal MRI of the brain, MRA of the brain, over the years, she tried different preventive medications, Zonegran, nortriptyline, eventually started Topamax currently taking 100 mg every night,  She has about 2 migraine headaches each months, lasting for couple hours, tried different triptans treatment in the past, Imitrex cause jaw tightness, Maxalt and Relpax does not work well, Secondary school teacher works well well for her, take away her headache in about 90 minutes, she often takes Secondary school teacher with Aleve  She has stopped contraceptive recently, is planning on conceiving,  UPDATE June 02 2018:YY She has been ordered on Mar 11, 2018, headache is under good control with pregnancy, postpartum, she had frequent headaches almost daily basis, couple times each week, she will get her typical migraine headache starting from muscle tension, then settled to her left retro-orbital area pressure headaches, sometimes pounding, movement made it worse.  She has been taking Fioricet about twice a  week,  She has abnormal glucose during the pregnancy, is taking saxenda now.  Previously Topamax worked well as migraine prevention, Amerge works as migraine abortive treatment UPDATE 12/4/19CM Ms Schultes, 37 year old female returns for follow-up with history of migraine headaches.  Some of her headaches recently had come from her neck, she is currently undergoing physical therapy for some neck and shoulder issues.  Her typical headache starts in the left retro-orbital area with pressure pounding movement makes it worse.    Reviewed previous MRI MRA from last year which were both normal. Amerge continues to work acutely.  Skelaxin also works well or Geophysicist/field seismologist.  She has had about 4 migraines in the last month which started in the neck area.  She is on Flexeril but does not take it very much feels Skelaxin works better.  Blood pressures noted to fluctuate from lying sitting and standing position she does have mild dizziness with changing position.  She was encouraged to increase her water intake.  She returns for reevaluation  REVIEW OF SYSTEMS: Full 14 system review of systems performed and notable only for those listed, all others are neg:  Constitutional: neg  Cardiovascular: neg Ear/Nose/Throat: neg  Skin: neg Eyes: neg Respiratory: neg Gastroitestinal: neg  Hematology/Lymphatic: neg  Endocrine: neg Musculoskeletal:neg Allergy/Immunology: neg Neurological: History of migraine Psychiatric: neg Sleep : neg   ALLERGIES: Allergies  Allergen Reactions  . Imitrex [Sumatriptan] Other (See Comments)    Jaw issues   . Amoxicillin Rash    Has patient had a PCN reaction causing immediate rash, facial/tongue/throat swelling, SOB or lightheadedness with hypotension: No Has patient had a PCN reaction causing severe rash involving mucus membranes or skin necrosis: No Has patient had a PCN reaction that  required hospitalization: No Has patient had a PCN reaction occurring within the last 10  years: Yes If all of the above answers are "NO", then may proceed with Cephalosporin use.     HOME MEDICATIONS: Outpatient Medications Prior to Visit  Medication Sig Dispense Refill  . acetaminophen (TYLENOL) 500 MG tablet Take 1,000 mg by mouth as needed for moderate pain.     . B Complex Vitamins (B COMPLEX PO) Take 1 tablet by mouth daily.    . busPIRone (BUSPAR) 5 MG tablet Take 5 mg by mouth 3 (three) times daily.    . butalbital-acetaminophen-caffeine (FIORICET, ESGIC) 50-325-40 MG tablet TAKE 1 TABLET BY MOUTH EVERY 6 HOURS AS NEEDED FOR HEADACHE 15 tablet 5  . Cholecalciferol (VITAMIN D-3 PO) Take 1,000 Units by mouth daily.    . citalopram (CELEXA) 40 MG tablet Take by mouth.    . cyclobenzaprine (FLEXERIL) 10 MG tablet Take 10 mg by mouth 3 (three) times daily as needed for muscle spasms.    Marland Kitchen ibuprofen (ADVIL,MOTRIN) 600 MG tablet Take 1 tablet (600 mg total) by mouth every 6 (six) hours. 30 tablet 0  . levocetirizine (XYZAL) 5 MG tablet Take 5 mg by mouth every evening.    . Magnesium 500 MG TABS Take 500 mg by mouth daily.    . magnesium hydroxide (MILK OF MAGNESIA) 400 MG/5ML suspension Take 5 mLs by mouth as needed for mild constipation.     . metaxalone (SKELAXIN) 800 MG tablet Take 1 tablet (800 mg total) by mouth as needed for muscle spasms. 30 tablet 6  . naratriptan (AMERGE) 2.5 MG tablet Take 1 tablet (2.5 mg total) by mouth as needed for migraine. Take one (1) tablet at onset of headache; if returns or does not resolve, may repeat after 4 hours; do not exceed five (5) mg in 24 hours. 12 tablet 6  . ondansetron (ZOFRAN-ODT) 4 MG disintegrating tablet Take 4 mg by mouth every 8 (eight) hours as needed.    . ranitidine (ZANTAC) 150 MG tablet Take by mouth.    Marland Kitchen SAXENDA 18 MG/3ML SOPN Inject 3 mg into the skin daily.  11  . topiramate (TOPAMAX) 100 MG tablet Take 1 tablet (100 mg total) by mouth 2 (two) times daily. 60 tablet 11  . IRON PO Take 1 tablet by mouth every other  day.    . magnesium oxide (MAG-OX) 400 MG tablet Take 400 mg by mouth 2 (two) times daily.    . metoprolol succinate (TOPROL-XL) 50 MG 24 hr tablet TAKE ONE TABLET (50 MG DOSE) BY MOUTH DAILY.  1  . riboflavin (VITAMIN B-2) 100 MG TABS tablet Take 100 mg by mouth at bedtime.     No facility-administered medications prior to visit.     PAST MEDICAL HISTORY: Past Medical History:  Diagnosis Date  . Anxiety   . Eczema   . GERD (gastroesophageal reflux disease)   . Gestational diabetes    glyburide  . Gestational diabetes mellitus (GDM), antepartum   . Hx of varicella   . Hypertension    labetolol 200 mg bid  . Migraine     PAST SURGICAL HISTORY: Past Surgical History:  Procedure Laterality Date  . BREAST REDUCTION SURGERY    . KNEE SURGERY Left   . WISDOM TOOTH EXTRACTION      FAMILY HISTORY: Family History  Problem Relation Age of Onset  . Lupus Father        lupus anticoagulant  . Pulmonary embolism Father   .  Hiatal hernia Father   . Arrhythmia Father   . Deep vein thrombosis Father   . Migraines Father   . GER disease Father   . Multiple sclerosis Paternal Aunt   . Lupus Paternal Aunt   . COPD Maternal Grandmother   . Cancer Maternal Grandfather        colon  . Hypertension Mother   . ADD / ADHD Sister     SOCIAL HISTORY: Social History   Socioeconomic History  . Marital status: Married    Spouse name: Not on file  . Number of children: 1  . Years of education: Masters  . Highest education level: Not on file  Occupational History  . Occupation: Radiation protection practitionerpeech/Language Pathologist  Social Needs  . Financial resource strain: Not on file  . Food insecurity:    Worry: Not on file    Inability: Not on file  . Transportation needs:    Medical: Not on file    Non-medical: Not on file  Tobacco Use  . Smoking status: Never Smoker  . Smokeless tobacco: Never Used  Substance and Sexual Activity  . Alcohol use: Yes    Comment: Occasional use  . Drug use: No   . Sexual activity: Not on file  Lifestyle  . Physical activity:    Days per week: Not on file    Minutes per session: Not on file  . Stress: Not on file  Relationships  . Social connections:    Talks on phone: Not on file    Gets together: Not on file    Attends religious service: Not on file    Active member of club or organization: Not on file    Attends meetings of clubs or organizations: Not on file    Relationship status: Not on file  . Intimate partner violence:    Fear of current or ex partner: Not on file    Emotionally abused: Not on file    Physically abused: Not on file    Forced sexual activity: Not on file  Other Topics Concern  . Not on file  Social History Narrative   Lives at home with her husband and child.   Right-handed.   Two 12oz bottles of Diet Dr. Reino KentPepper per day.     PHYSICAL EXAM  Vitals:   09/28/18 0757  BP: 117/86  Pulse: 94  Weight: 163 lb 3.2 oz (74 kg)  Height: 5' 2.75" (1.594 m)   Body mass index is 29.14 kg/m.  Generalized: Well developed, in no acute distress  Head: normocephalic and atraumatic,. Oropharynx benign  Neck: Supple,  Musculoskeletal: No deformity  Skin no rash or edema Neurological examination   Mentation: Alert oriented to time, place, history taking. Attention span and concentration appropriate. Recent and remote memory intact.  Follows all commands speech and language fluent.   Cranial nerve II-XII: Pupils were equal round reactive to light extraocular movements were full, visual field were full on confrontational test. Facial sensation and strength were normal. hearing was intact to finger rubbing bilaterally. Uvula tongue midline. head turning and shoulder shrug were normal and symmetric.Tongue protrusion into cheek strength was normal. Motor: normal bulk and tone, full strength in the BUE, BLE,  Sensory: normal and symmetric to light touch,  Coordination: finger-nose-finger, heel-to-shin bilaterally, no  dysmetria Reflexes: Brachioradialis 2/2, biceps 2/2, triceps 2/2, patellar 2/2, Achilles 2/2, plantar responses were flexor bilaterally. Gait and Station: Rising up from seated position without assistance, normal stance,  moderate stride, good arm swing,  smooth turning, able to perform tiptoe, and heel walking without difficulty. Tandem gait is steady  DIAGNOSTIC DATA (LABS, IMAGING, TESTING) - I reviewed patient records, labs, notes, testing and imaging myself where available.  Lab Results  Component Value Date   WBC 7.4 03/12/2018   HGB 9.6 (L) 03/12/2018   HCT 28.9 (L) 03/12/2018   MCV 88.7 03/12/2018   PLT 209 03/12/2018      Component Value Date/Time   NA 136 03/10/2018 2333   K 4.3 03/10/2018 2333   CL 106 03/10/2018 2333   CO2 21 (L) 03/10/2018 2333   GLUCOSE 99 03/10/2018 2333   BUN 9 03/10/2018 2333   CREATININE 0.58 03/10/2018 2333   CALCIUM 9.3 03/10/2018 2333   PROT 6.6 03/10/2018 2333   ALBUMIN 3.1 (L) 03/10/2018 2333   AST 17 03/10/2018 2333   ALT 9 (L) 03/10/2018 2333   ALKPHOS 151 (H) 03/10/2018 2333   BILITOT 0.3 03/10/2018 2333   GFRNONAA >60 03/10/2018 2333   GFRAA >60 03/10/2018 2333    ASSESSMENT AND PLAN Luanna Weesner Shuey is a 37 y.o. female   here to follow-up for migraine headache.  MRI and MRA in the past have been normal.. She does have some dizziness with changing positions and her blood pressure does fluctuate some.  She has been taken off of her blood pressure medications  PLAN: Continue Topamax 100 mg twice a day Continue Amerge acutely Continue Skelaxin as needed Continue Fioricet as needed we will refill  migraine tracker APP if headaches worsen Increase water intake due to positional fluctuations in blood pressure Given information on migraine basics, exercise stress reduction triggers etc. Follow-up in 6 months Nilda Riggs, [facility], [facility], APRN  [facility] [address] [place], [place] [address] [phone]

## 2018-09-28 ENCOUNTER — Encounter: Payer: Self-pay | Admitting: Nurse Practitioner

## 2018-09-28 ENCOUNTER — Ambulatory Visit (INDEPENDENT_AMBULATORY_CARE_PROVIDER_SITE_OTHER): Payer: 59 | Admitting: Nurse Practitioner

## 2018-09-28 VITALS — BP 117/86 | HR 94 | Ht 62.75 in | Wt 163.2 lb

## 2018-09-28 DIAGNOSIS — IMO0002 Reserved for concepts with insufficient information to code with codable children: Secondary | ICD-10-CM

## 2018-09-28 DIAGNOSIS — G43709 Chronic migraine without aura, not intractable, without status migrainosus: Secondary | ICD-10-CM

## 2018-09-28 MED ORDER — BUTALBITAL-APAP-CAFFEINE 50-325-40 MG PO TABS: ORAL_TABLET | ORAL | 5 refills | Status: DC

## 2018-09-28 NOTE — Patient Instructions (Signed)
Continue Topamax 100 mg twice a day Continue Amerge acutely Continue Skelaxin as needed Migraine tracker APP if headaches worsen Increase water intake due to positional fluctuations in blood pressure Given information on migraine basics, exercise stress reduction triggers etc. Follow-up in 6 months

## 2018-09-28 NOTE — Progress Notes (Signed)
I have reviewed and agreed above plan. 

## 2019-04-03 ENCOUNTER — Ambulatory Visit: Payer: 59 | Admitting: Neurology

## 2019-04-04 ENCOUNTER — Encounter: Payer: Self-pay | Admitting: Neurology

## 2019-04-04 ENCOUNTER — Ambulatory Visit (INDEPENDENT_AMBULATORY_CARE_PROVIDER_SITE_OTHER): Payer: BC Managed Care – PPO | Admitting: Neurology

## 2019-04-04 ENCOUNTER — Other Ambulatory Visit: Payer: Self-pay

## 2019-04-04 DIAGNOSIS — G43709 Chronic migraine without aura, not intractable, without status migrainosus: Secondary | ICD-10-CM

## 2019-04-04 DIAGNOSIS — IMO0002 Reserved for concepts with insufficient information to code with codable children: Secondary | ICD-10-CM

## 2019-04-04 MED ORDER — NARATRIPTAN HCL 2.5 MG PO TABS: 2.5000 mg | ORAL_TABLET | ORAL | 11 refills | Status: DC | PRN

## 2019-04-04 MED ORDER — ONDANSETRON 4 MG PO TBDP: 4.0000 mg | ORAL_TABLET | Freq: Three times a day (TID) | ORAL | 6 refills | Status: DC | PRN

## 2019-04-04 MED ORDER — METAXALONE 800 MG PO TABS
800.0000 mg | ORAL_TABLET | ORAL | 11 refills | Status: DC | PRN
Start: 2019-04-04 — End: 2020-05-29

## 2019-04-04 MED ORDER — TOPIRAMATE 100 MG PO TABS: 100.0000 mg | ORAL_TABLET | Freq: Two times a day (BID) | ORAL | 4 refills | Status: DC

## 2019-04-04 NOTE — Progress Notes (Signed)
   PATIENT: Chelsea Frost DOB: 09/09/1981  Virtual Visit via video  I connected with Chelsea Frost on 04/04/19 at  by video and verified that I am speaking with the correct person using two identifiers.   I discussed the limitations, risks, security and privacy concerns of performing an evaluation and management service by video and the availability of in person appointments. I also discussed with the patient that there may be a patient responsible charge related to this service. The patient expressed understanding and agreed to proceed.  HISTORICAL  Chelsea Frost, is a 38 year old female, seen in refer by  her primary care doctor Helane Rima  for evaluation of migraine headaches, initial evaluation was on July 15 2017.  Reviewed and summarized the referring note, she had a history of  obesity, impaired glucose intolerance, Vitamin D deficiency, anxiety disorder, acid reflux, chronic migraine headaches,  She had migraine headaches since twenties, her typical migraine starting from neck, spreading forward, become severe pounding headache, with high-pitched noise, smell sensitivity, nauseous, mild light sensitivity, movement make her headache worse,  She was under the care of  headache and neck pain clinic by Dr.Lewit, most recent visit was on Mar 24 2017.  Reported normal MRI of the brain, MRA of the brain, over the years, she tried different preventive medications, Zonegran, nortriptyline, eventually started Topamax currently taking 100 mg every night,  She has about 2 migraine headaches each months, lasting for couple hours, tried different triptans treatment in the past, Imitrex cause jaw tightness, Maxalt and Relpax does not work well, Electrical engineer works well well for her, take away her headache in about 90 minutes, she often takes Electrical engineer with Aleve  She has stopped contraceptive recently, is planning on conceiving,  UPDATE June 02 2018:  She has been ordered on  Mar 11, 2018, headache is under good control with pregnancy, postpartum, she had frequent headaches almost daily basis, couple times each week, she will get her typical migraine headache starting from muscle tension, then settled to her left retro-orbital area pressure headaches, sometimes pounding, movement made it worse.  She has been taking Fioricet about twice a week,  She has abnormal glucose during the pregnancy, is taking saxenda now.  Previously Topamax worked well as migraine prevention, Amerge works as migraine abortive treatment  UPDATE June 9th 2020: She continues to have occasionally headaches, but much improved, couple times each month, tolerating Topamax 200 mg daily, taking Amerge as needed, which usually take away her headache in 1 hour, sometimes need combination of Skelaxin, and NSAIDs,    Observations/Objective: I have reviewed problem lists, medications, allergies.  Awake, alert, oriented to history taking, and casual conversation, moving 4 extremities without difficulty, ambulate without difficulty  Assessment and Plan: Chronic migraine headaches  Topamax 100 mg twice a day as preventive medications  Amerge as needed, may mix together with Skelaxin, naproxen, Zofran for prolonged severe headaches  Follow Up Instructions:  In 1 year with nurse practitioner Judson Roch    I discussed the assessment and treatment plan with the patient. The patient was provided an opportunity to ask questions and all were answered. The patient agreed with the plan and demonstrated an understanding of the instructions.   The patient was advised to call back or seek an in-person evaluation if the symptoms worsen or if the condition fails to improve as anticipated.  I provided 15 minutes of non-face-to-face time during this encounter.

## 2019-07-11 ENCOUNTER — Encounter: Payer: Self-pay | Admitting: Cardiology

## 2019-07-12 ENCOUNTER — Other Ambulatory Visit: Payer: Self-pay

## 2019-07-12 ENCOUNTER — Ambulatory Visit (INDEPENDENT_AMBULATORY_CARE_PROVIDER_SITE_OTHER): Payer: BC Managed Care – PPO | Admitting: Cardiology

## 2019-07-12 VITALS — BP 119/86 | HR 84 | Ht 62.75 in | Wt 156.3 lb

## 2019-07-12 DIAGNOSIS — R0602 Shortness of breath: Secondary | ICD-10-CM | POA: Diagnosis not present

## 2019-07-12 DIAGNOSIS — R002 Palpitations: Secondary | ICD-10-CM

## 2019-07-12 DIAGNOSIS — R0789 Other chest pain: Secondary | ICD-10-CM

## 2019-07-12 MED ORDER — METOPROLOL SUCCINATE ER 25 MG PO TB24: 25.0000 mg | ORAL_TABLET | Freq: Every day | ORAL | 2 refills | Status: DC

## 2019-07-12 NOTE — Progress Notes (Signed)
Primary Physician:  Gregor Hams, FNP   Patient ID: Chelsea Frost, female    DOB: 12-07-1980, 38 y.o.   MRN: [id number]  Subjective:    Chief Complaint  Patient presents with  . Palpitations    follow up    HPI: Chelsea Frost  is a 38 y.o. female  with history of gestational diabetes, mild to moderate obesity, chronic migraine headaches, essential hypertension, last seen in April 2019 for palpitations and DOE. She now presents for follow up.  She underwent echocardiogram in April 2019 that was essentially normal. Holter monitoring revealed rare PAC and PVC's.    She has made changes to her diet and is staying well hydrated. Denies leg edema, prior history of DVT, no history of syncope or family history of sudden cardiac death or cardiomyopathy in the family.   She is now here for follow up for palpitations, initially her palpitations resolved after delivery; however, she recently again developed symptoms of palpitations and also chest pain and shortness of breath. Symptoms suddenly started on 06/21/19 at 10:30 lasting for 1.5 hours and then have been occurring intermittently since. She has had some slight improvement in palpitations over the last few weeks. Reports one episode where she felt as though she may pass out. Has not noticed any heart racing.   She has also noticed chest pain that is persistent on a scale of 1-2/10 with occasional episodes of increased intensity at 4/10. Not worsened with breathing, position, or walking. States she has increased awareness of her chest. States that she is cold all the time.   She mostly notices the shortness of breath with climbing stairs or talking. Checks her SpO2 level at home and always normal. She was evaluated by her PCP, had normal CXR and labs. COVID testing negative a few days after symptoms started. She was evaluated by [facility], had echocardiogram performed as well as repeat holter monitor that showed episodes  of PVC's and sinus tachycardia.   She did start back on the saxenda prior to her symptoms starting, but had been on 1 year before without problems. Has had 65 lb weight loss. Has Mirena in place for [facility].   Past Medical History:  Diagnosis Date  . Anxiety   . Eczema   . GERD (gastroesophageal reflux disease)   . Gestational diabetes    glyburide  . Gestational diabetes mellitus (GDM), antepartum   . Hx of varicella   . Hypertension    labetolol 200 mg bid  . Migraine     Past Surgical History:  Procedure Laterality Date  . BREAST REDUCTION SURGERY    . KNEE SURGERY Left   . WISDOM TOOTH EXTRACTION      Social History   Socioeconomic History  . Marital status: Married    Spouse name: Not on file  . Number of children: 2  . Years of education: Masters  . Highest education level: Not on file  Occupational History  . Occupation: Research scientist (life sciences)  Social Needs  . Financial resource strain: Not on file  . Food insecurity    Worry: Not on file    Inability: Not on file  . Transportation needs    Medical: Not on file    Non-medical: Not on file  Tobacco Use  . Smoking status: Never Smoker  . Smokeless tobacco: Never Used  Substance and Sexual Activity  . Alcohol use: Yes    Comment: Occasional use  . Drug use: No  .  Sexual activity: Not on file  Lifestyle  . Physical activity    Days per week: Not on file    Minutes per session: Not on file  . Stress: Not on file  Relationships  . Social Musicianconnections    Talks on phone: Not on file    Gets together: Not on file    Attends religious service: Not on file    Active member of club or organization: Not on file    Attends meetings of clubs or organizations: Not on file    Relationship status: Not on file  . Intimate partner violence    Fear of current or ex partner: Not on file    Emotionally abused: Not on file    Physically abused: Not on file    Forced sexual activity: Not on file  Other Topics Concern   . Not on file  Social History Narrative   Lives at home with her husband and child.   Right-handed.   Two 12oz bottles of Diet Dr. Reino KentPepper per day.    Review of Systems  Constitution: Negative for decreased appetite, malaise/fatigue, weight gain and weight loss.  Eyes: Negative for visual disturbance.  Cardiovascular: Positive for chest pain, dyspnea on exertion and palpitations. Negative for claudication, leg swelling, orthopnea and syncope.  Respiratory: Negative for hemoptysis and wheezing.   Endocrine: Negative for cold intolerance and heat intolerance.  Hematologic/Lymphatic: Does not bruise/bleed easily.  Skin: Negative for nail changes.  Musculoskeletal: Negative for muscle weakness and myalgias.  Gastrointestinal: Negative for abdominal pain, change in bowel habit, nausea and vomiting.  Neurological: Negative for difficulty with concentration, dizziness, focal weakness and headaches.  Psychiatric/Behavioral: Negative for altered mental status and suicidal ideas.  All other systems reviewed and are negative.     Objective:  Blood pressure 119/86, pulse 84, height 5' 2.75" (1.594 m), weight 156 lb 4.8 oz (70.9 kg), SpO2 99 %, unknown if currently breastfeeding. Body mass index is 27.91 kg/m.    Physical Exam  Constitutional: She is oriented to person, place, and time. Vital signs are normal. She appears well-developed and well-nourished.  HENT:  Head: Normocephalic and atraumatic.  Neck: Normal range of motion.  Cardiovascular: Normal rate, regular rhythm, normal heart sounds and intact distal pulses.  Pulmonary/Chest: Effort normal and breath sounds normal. No accessory muscle usage. No respiratory distress.  Abdominal: Soft. Bowel sounds are normal.  Musculoskeletal: Normal range of motion.  Neurological: She is alert and oriented to person, place, and time.  Skin: Skin is warm and dry.  Vitals reviewed.  Radiology: No results found.  Laboratory examination:     CMP Latest Ref Rng & Units 03/10/2018 02/28/2018 01/13/2018  Glucose 65 - 99 mg/dL 99 94 99  BUN 6 - 20 mg/dL 9 6 5(L)  Creatinine 1.610.44 - 1.00 mg/dL 0.960.58 0.450.44 4.090.61  Sodium 135 - 145 mmol/L 136 135 129(L)  Potassium 3.5 - 5.1 mmol/L 4.3 3.9 3.2(L)  Chloride 101 - 111 mmol/L 106 107 102  CO2 22 - 32 mmol/L 21(L) 19(L) 19(L)  Calcium 8.9 - 10.3 mg/dL 9.3 8.1(X8.8(L) 8.3(L)  Total Protein 6.5 - 8.1 g/dL 6.6 6.8 9.1(Y6.2(L)  Total Bilirubin 0.3 - 1.2 mg/dL 0.3 7.8(G0.2(L) 0.7  Alkaline Phos 38 - 126 U/L 151(H) 119 80  AST 15 - 41 U/L 17 14(L) 14(L)  ALT 14 - 54 U/L 9(L) 9(L) 10(L)   CBC Latest Ref Rng & Units 03/12/2018 03/10/2018 02/28/2018  WBC 4.0 - 10.5 K/uL 7.4 9.2 8.6  Hemoglobin 12.0 - 15.0 g/dL 4.0(J9.6(L) 10.2(L) 9.8(L)  Hematocrit 36.0 - 46.0 % 28.9(L) 30.4(L) 28.7(L)  Platelets 150 - 400 K/uL 209 247 238   Lipid Panel  No results found for: CHOL, TRIG, HDL, CHOLHDL, VLDL, LDLCALC, LDLDIRECT HEMOGLOBIN A1C No results found for: HGBA1C, MPG TSH No results for input(s): TSH in the last 8760 hours.  PRN Meds:. Medications Discontinued During This Encounter  Medication Reason  . acetaminophen (TYLENOL) 500 MG tablet   . Cholecalciferol (VITAMIN D-3 PO)   . ibuprofen (ADVIL,MOTRIN) 600 MG tablet   . magnesium hydroxide (MILK OF MAGNESIA) 400 MG/5ML suspension   . Magnesium 500 MG TABS   . ranitidine (ZANTAC) 150 MG tablet    Current Meds  Medication Sig  . B Complex Vitamins (B COMPLEX PO) Take 1 tablet by mouth daily.  . busPIRone (BUSPAR) 5 MG tablet Take 5 mg by mouth 3 (three) times daily.  . butalbital-acetaminophen-caffeine (FIORICET, ESGIC) 50-325-40 MG tablet TAKE 1 TABLET BY MOUTH EVERY 6 HOURS AS NEEDED FOR HEADACHE  . citalopram (CELEXA) 40 MG tablet Take by mouth.  . Famotidine (PEPCID PO) Take 1 tablet by mouth daily.  Marland Kitchen. levocetirizine (XYZAL) 5 MG tablet Take 5 mg by mouth every evening.  . magnesium oxide (MAG-OX) 400 MG tablet Take 200 mg by mouth daily.  . metaxalone (SKELAXIN)  800 MG tablet Take 1 tablet (800 mg total) by mouth as needed for muscle spasms.  . naratriptan (AMERGE) 2.5 MG tablet Take 1 tablet (2.5 mg total) by mouth as needed for migraine. Take one (1) tablet at onset of headache; if returns or does not resolve, may repeat after 4 hours; do not exceed five (5) mg in 24 hours.  . ondansetron (ZOFRAN-ODT) 4 MG disintegrating tablet Take 1 tablet (4 mg total) by mouth every 8 (eight) hours as needed.  Marland Kitchen. SAXENDA 18 MG/3ML SOPN Inject 3 mg into the skin daily.  Marland Kitchen. topiramate (TOPAMAX) 100 MG tablet Take 1 tablet (100 mg total) by mouth 2 (two) times daily.    Cardiac Studies:   Holter monitor 06/29/2019: Patient remained in sinus rhythm slowest heart rate 52 at 4:15 AM on day 2, maximum heart rate 145 bpm at 8:49 PM on day 2.  Average heart rate 83 bpm.  Sinus arrhythmia was noted with heart rate 52 bpm at 4:15 AM on September 4.    139 episodes of sustained sinus tachycardia noted with total duration 299.5 minutes representing 10.6% of total time.  The fastest episode of tachycardia with heart rate 126 bpm. The longest episode lasted 12 minutes.    5 episodes of sustained sinus bradycardia noted with total duration 4.1 minutes representing less than 1% total time.  The slowest episode of bradycardia with heart rate 58 bpm. The longest episode lasted 1 minute.    190 isolated PVCs versus aberrantly conducted PACs noted.    Longest R-R interval 1.38 seconds at 4:15 AM on September 4.   1.51 seconds R to R interval was noted at 1:45 AM on day 2, but it was compensatory pause after PVC versus aberrantly conducted PAC.    No SVT, no A. fib, no VT, no long pauses 3 seconds or more noted.  Echocardiogram 01/27/2018: Left ventricle cavity is normal in size. Mild concentric hypertrophy of the left ventricle. Normal global wall motion. Calculated EF 55%. Left atrial cavity is mildly dilated at 4.4 cm. Structurally normal mitral valve with  trace regurgitation. Structurally normal tricuspid valve with trace regurgitation.  Holter Monitor 48 hours 01/05/2018: Predominant rhythm is normal sinus rhythm. Rare PACs and PVCs. Symptoms of palpitations reveal normal sinus rhythm.  Assessment:   Palpitations - Plan: EKG 12-Lead, PCV CARDIAC STRESS TEST  Atypical chest pain  Shortness of breath  EKG 07/12/2019: Normal sinus rhythm at 81 bpm, normal axis, nonspecific T wave abnormality.   Recommendations:   I have reviewed the recently obtained test results performed at [facility], echocardiogram has actually improved compared to 1 year ago. Over the last one month, she has suddenly developed dyspnea on exertion, worsening palpitations, and chest discomfort. She was able to hike 4 miles without difficulty a few days prior to her symptoms. No cough, hemoptysis, or recent travel to suggest PE; however, could consider further evaluation for this. EKG is without acute abnormalities. No chest wall tenderness on exam. I will perform routine treadmill stress testing to evaluate her heart rate response to exercise.   She had occasional PAC and PVC on holter monitor. She was noted to have 10% sinus tachycardia. Question if her symptoms may be related to inappropriate sinus tachycardia. Will start her on Metoprolol 25 mg daily, but advised to start after her treadmill stress test.   Since she was last seen by [place] in 2019, she has made lifestyle changes and has lost approximately 50 lbs. I have congratulated her on this and provided positive reinforcement. She has been using Saxenda to help with her weight loss, and coincidentally, started this a few days prior to onset of her symptoms. Although not a normal side effect, if other etiologies are not found, could consider evaluation of this. I will see her back after her stress test for follow up, encouraged her to contact me sooner if needed.  Toniann Fail, MSN, APRN, FNP-C [facility].  [place] Office: [phone] Fax: [phone]

## 2019-07-14 ENCOUNTER — Encounter: Payer: Self-pay | Admitting: Cardiology

## 2019-07-19 ENCOUNTER — Other Ambulatory Visit: Payer: Self-pay

## 2019-07-19 ENCOUNTER — Ambulatory Visit (INDEPENDENT_AMBULATORY_CARE_PROVIDER_SITE_OTHER): Payer: BC Managed Care – PPO

## 2019-07-19 DIAGNOSIS — R002 Palpitations: Secondary | ICD-10-CM

## 2019-07-21 NOTE — Telephone Encounter (Signed)
Please read

## 2019-08-10 ENCOUNTER — Ambulatory Visit: Payer: BC Managed Care – PPO | Admitting: Cardiology

## 2019-08-10 ENCOUNTER — Other Ambulatory Visit: Payer: Self-pay

## 2019-08-10 ENCOUNTER — Encounter: Payer: Self-pay | Admitting: Cardiology

## 2019-08-10 VITALS — BP 124/83 | HR 75 | Ht 63.0 in | Wt 155.0 lb

## 2019-08-10 DIAGNOSIS — R002 Palpitations: Secondary | ICD-10-CM | POA: Diagnosis not present

## 2019-08-10 DIAGNOSIS — R0602 Shortness of breath: Secondary | ICD-10-CM | POA: Diagnosis not present

## 2019-08-10 NOTE — Progress Notes (Signed)
Primary Physician:  Gregor Hams, FNP   Patient ID: Chelsea Frost, female    DOB: 06/14/1981, 38 y.o.   MRN: [id number]  Subjective:    Chief Complaint  Patient presents with  . Palpitations  . Chest Pain  . Shortness of Breath  . Follow-up    gxt results     HPI: Chelsea Frost  is a 38 y.o. female  with history of gestational diabetes, mild to moderate obesity, chronic migraine headaches, essential hypertension, recently seen for complaints of worsening palpitations, dyspnea on exertion and persistent chest pain.  She underwent treadmill stress testing and presents for follow-up.  She was also started on metoprolol in view of PVCs and episodes of sinus tachycardia that were noted on Holter monitor performed by [facility] cardiology.  Previously echocardiogram in April 2019 had showed mild LVH; however, this was not noted by her recent echo with [facility].  Since being on Metoprolol, her symptoms have essentially resolved. She has resumed walking and tolerates this well without chest pain or shortness of breath. She has not had any significant palpitations. States that she feels much better. Prior to her symptoms starting, she had potentially had viral illness.   She is on Saxenda for weight loss.Has had 65 lb weight loss. Has Mirena in place for [facility].   Past Medical History:  Diagnosis Date  . Anxiety   . Eczema   . GERD (gastroesophageal reflux disease)   . Gestational diabetes    glyburide  . Gestational diabetes mellitus (GDM), antepartum   . Hx of varicella   . Hypertension    labetolol 200 mg bid  . Migraine     Past Surgical History:  Procedure Laterality Date  . BREAST REDUCTION SURGERY    . KNEE SURGERY Left   . WISDOM TOOTH EXTRACTION      Social History   Socioeconomic History  . Marital status: Married    Spouse name: Not on file  . Number of children: 2  . Years of education: Masters  . Highest education level: Not on file  Occupational  History  . Occupation: Research scientist (life sciences)  Social Needs  . Financial resource strain: Not on file  . Food insecurity    Worry: Not on file    Inability: Not on file  . Transportation needs    Medical: Not on file    Non-medical: Not on file  Tobacco Use  . Smoking status: Never Smoker  . Smokeless tobacco: Never Used  Substance and Sexual Activity  . Alcohol use: Yes    Comment: Occasional use  . Drug use: No  . Sexual activity: Not on file  Lifestyle  . Physical activity    Days per week: Not on file    Minutes per session: Not on file  . Stress: Not on file  Relationships  . Social Herbalist on phone: Not on file    Gets together: Not on file    Attends religious service: Not on file    Active member of club or organization: Not on file    Attends meetings of clubs or organizations: Not on file    Relationship status: Not on file  . Intimate partner violence    Fear of current or ex partner: Not on file    Emotionally abused: Not on file    Physically abused: Not on file    Forced sexual activity: Not on file  Other Topics Concern  . Not  on file  Social History Narrative   Lives at home with her husband and child.   Right-handed.   Two 12oz bottles of Diet Dr. Reino Kent per day.    Review of Systems  Constitution: Negative for decreased appetite, malaise/fatigue, weight gain and weight loss.  Eyes: Negative for visual disturbance.  Cardiovascular: Negative for chest pain, claudication, dyspnea on exertion, leg swelling, orthopnea, palpitations and syncope.  Respiratory: Negative for hemoptysis and wheezing.   Endocrine: Negative for cold intolerance and heat intolerance.  Hematologic/Lymphatic: Does not bruise/bleed easily.  Skin: Negative for nail changes.  Musculoskeletal: Negative for muscle weakness and myalgias.  Gastrointestinal: Negative for abdominal pain, change in bowel habit, nausea and vomiting.  Neurological: Negative for  difficulty with concentration, dizziness, focal weakness and headaches.  Psychiatric/Behavioral: Negative for altered mental status and suicidal ideas.  All other systems reviewed and are negative.     Objective:  Blood pressure 124/83, pulse 75, height  (1.6 m), weight 155 lb (70.3 kg), SpO2 97 %, unknown if currently breastfeeding. Body mass index is 27.46 kg/m.    Physical Exam  Constitutional: She is oriented to person, place, and time. Vital signs are normal. She appears well-developed and well-nourished.  HENT:  Head: Normocephalic and atraumatic.  Neck: Normal range of motion.  Cardiovascular: Normal rate, regular rhythm, normal heart sounds and intact distal pulses.  Pulmonary/Chest: Effort normal and breath sounds normal. No accessory muscle usage. No respiratory distress.  Abdominal: Soft. Bowel sounds are normal.  Musculoskeletal: Normal range of motion.  Neurological: She is alert and oriented to person, place, and time.  Skin: Skin is warm and dry.  Vitals reviewed.  Radiology: No results found.  Laboratory examination:    CMP Latest Ref Rng & Units 03/10/2018 02/28/2018 01/13/2018  Glucose 65 - 99 mg/dL 99 94 99  BUN 6 - 20 mg/dL 9 6 5(L)  Creatinine 4.09 - 1.00 mg/dL 8.11 9.14 7.82  Sodium 135 - 145 mmol/L 136 135 129(L)  Potassium 3.5 - 5.1 mmol/L 4.3 3.9 3.2(L)  Chloride 101 - 111 mmol/L 106 107 102  CO2 22 - 32 mmol/L 21(L) 19(L) 19(L)  Calcium 8.9 - 10.3 mg/dL 9.3 9.5(A) 8.3(L)  Total Protein 6.5 - 8.1 g/dL 6.6 6.8 2.1(H)  Total Bilirubin 0.3 - 1.2 mg/dL 0.3 0.8(M) 0.7  Alkaline Phos 38 - 126 U/L 151(H) 119 80  AST 15 - 41 U/L 17 14(L) 14(L)  ALT 14 - 54 U/L 9(L) 9(L) 10(L)   CBC Latest Ref Rng & Units 03/12/2018 03/10/2018 02/28/2018  WBC 4.0 - 10.5 K/uL 7.4 9.2 8.6  Hemoglobin 12.0 - 15.0 g/dL 5.7(Q) 10.2(L) 9.8(L)  Hematocrit 36.0 - 46.0 % 28.9(L) 30.4(L) 28.7(L)  Platelets 150 - 400 K/uL 209 247 238   Lipid Panel  No results found for: CHOL, TRIG,  HDL, CHOLHDL, VLDL, LDLCALC, LDLDIRECT HEMOGLOBIN A1C No results found for: HGBA1C, MPG TSH No results for input(s): TSH in the last 8760 hours.  PRN Meds:. There are no discontinued medications. Current Meds  Medication Sig  . B Complex Vitamins (B COMPLEX PO) Take 1 tablet by mouth daily.  . busPIRone (BUSPAR) 5 MG tablet Take 5 mg by mouth 3 (three) times daily.  . butalbital-acetaminophen-caffeine (FIORICET, ESGIC) 50-325-40 MG tablet TAKE 1 TABLET BY MOUTH EVERY 6 HOURS AS NEEDED FOR HEADACHE  . citalopram (CELEXA) 40 MG tablet Take by mouth.  . Famotidine (PEPCID PO) Take 1 tablet by mouth daily.  Marland Kitchen levocetirizine (XYZAL) 5 MG tablet Take  5 mg by mouth every evening.  . magnesium oxide (MAG-OX) 400 MG tablet Take 200 mg by mouth daily.  . metaxalone (SKELAXIN) 800 MG tablet Take 1 tablet (800 mg total) by mouth as needed for muscle spasms.  . metoprolol succinate (TOPROL-XL) 25 MG 24 hr tablet Take 1 tablet (25 mg total) by mouth daily. Take with or immediately following a meal.  . naratriptan (AMERGE) 2.5 MG tablet Take 1 tablet (2.5 mg total) by mouth as needed for migraine. Take one (1) tablet at onset of headache; if returns or does not resolve, may repeat after 4 hours; do not exceed five (5) mg in 24 hours.  . ondansetron (ZOFRAN-ODT) 4 MG disintegrating tablet Take 1 tablet (4 mg total) by mouth every 8 (eight) hours as needed.  Marland Kitchen. SAXENDA 18 MG/3ML SOPN Inject 3 mg into the skin daily.  Marland Kitchen. topiramate (TOPAMAX) 100 MG tablet Take 1 tablet (100 mg total) by mouth 2 (two) times daily.    Cardiac Studies:   Treadmill Exercise Stress   07/19/2019: Resting EKG normal sinus rhythm.  Stress EKG negative for myocardial ischemia. Chest pain is not present. The patient exercised for 15 minutes and 59 seconds on Bruce protocol;  Stage 3 held for 10 min per patient request. achieved 8.75 METs at 86% of maximum predicted heart rate.  Normal exercise tolerance.  Normal blood pressure  response.  No inducible arrhythmias.  Impression: Negative treadmill exercise stress test using Bruce protocol, stage III held for 10 minutes per patient request, total exercise duration 15:59 min, Mets achieved 8.75.  Echo 06/29/2019:  LVEF 55-60%. Normal LV wall thickness and size. No wall motion abnormalities. Aortic valve is trileaflet,, no stenosis or regurgitation. Trace MR. Trace TR. Trace PR. RVSP normal.   Holter monitor 06/29/2019: Patient remained in sinus rhythm slowest heart rate 52 at 4:15 AM on day 2, maximum heart rate 145 bpm at 8:49 PM on day 2.  Average heart rate 83 bpm.  Sinus arrhythmia was noted with heart rate 52 bpm at 4:15 AM on September 4.    139 episodes of sustained sinus tachycardia noted with total duration 299.5 minutes representing 10.6% of total time.  The fastest episode of tachycardia with heart rate 126 bpm. The longest episode lasted 12 minutes.    5 episodes of sustained sinus bradycardia noted with total duration 4.1 minutes representing less than 1% total time.  The slowest episode of bradycardia with heart rate 58 bpm. The longest episode lasted 1 minute.    190 isolated PVCs versus aberrantly conducted PACs noted.    Longest R-R interval 1.38 seconds at 4:15 AM on September 4.   1.51 seconds R to R interval was noted at 1:45 AM on day 2, but it was compensatory pause after PVC versus aberrantly conducted PAC.    No SVT, no A. fib, no VT, no long pauses 3 seconds or more noted.  Echocardiogram 01/27/2018: Left ventricle cavity is normal in size. Mild concentric hypertrophy of the left ventricle. Normal global wall motion. Calculated EF 55%. Left atrial cavity is mildly dilated at 4.4 cm. Structurally normal mitral valve with trace regurgitation. Structurally normal tricuspid valve with trace regurgitation.  Holter Monitor 48 hours 01/05/2018: Predominant rhythm is normal sinus rhythm. Rare PACs and PVCs. Symptoms of  palpitations reveal normal sinus rhythm.  Assessment:   Palpitations  Shortness of breath  EKG 07/12/2019: Normal sinus rhythm at 81 bpm, normal axis, nonspecific T wave abnormality.   Recommendations:  I discussed recently obtained treadmill stress test results with the patient, she had good exercise capacity and no EKG changes suggestive of ischemia.  Since last seen by me, she has had 1 episode of chest pain with some tingling sensation in her left arm.  Her symptoms are possibly related to muscle spasm she was also having some tension in her shoulder and neck area at that time.  She has not had any reoccurrence.  Her symptoms of palpitations, chest pain, and shortness of breath have overall significantly improved since being on metoprolol.  She is only had rare episodes of palpitations, but no heart racing like before.  Suspect patient may have had viral illness that precipitated inappropriate sinus tachycardia given her Holter monitor results.  She is tolerating metoprolol well and will continue with this.  She is not planning on getting pregnant again.  She has resumed walking and is tolerating this well.  Encouraged her to continue with B6 and B12 vitamins to help with her PVCs.  Her previous echocardiogram that was performed at [facility] cardiology had improved compared to our echocardiogram performed 1 year ago.  Blood pressure stable.  As her symptoms have improved, no changes were made to her medications today we will continue with watchful waiting.  I will see her back in in 4 months for follow-up on her symptoms and if she remains stable at that time, will consider as needed follow-up.  Toniann Fail, MSN, APRN, FNP-C [facility] [place] Office: [phone] Fax: [phone]

## 2019-10-25 ENCOUNTER — Other Ambulatory Visit: Payer: Self-pay

## 2019-10-25 MED ORDER — METOPROLOL SUCCINATE ER 25 MG PO TB24: 25.0000 mg | ORAL_TABLET | Freq: Every day | ORAL | 1 refills | Status: DC

## 2019-12-12 ENCOUNTER — Ambulatory Visit: Payer: BC Managed Care – PPO | Admitting: Cardiology

## 2019-12-18 ENCOUNTER — Ambulatory Visit: Payer: BC Managed Care – PPO | Attending: Internal Medicine

## 2019-12-18 DIAGNOSIS — Z20822 Contact with and (suspected) exposure to covid-19: Secondary | ICD-10-CM | POA: Insufficient documentation

## 2019-12-19 ENCOUNTER — Ambulatory Visit: Payer: BC Managed Care – PPO | Admitting: Cardiology

## 2019-12-19 LAB — NOVEL CORONAVIRUS, NAA: SARS-CoV-2, NAA: NOT DETECTED

## 2020-01-03 ENCOUNTER — Other Ambulatory Visit: Payer: Self-pay

## 2020-01-03 ENCOUNTER — Ambulatory Visit: Payer: BC Managed Care – PPO | Admitting: Cardiology

## 2020-01-03 ENCOUNTER — Encounter: Payer: Self-pay | Admitting: Cardiology

## 2020-01-03 VITALS — BP 127/92 | HR 76 | Temp 98.7°F | Ht 63.0 in | Wt 164.0 lb

## 2020-01-03 DIAGNOSIS — R002 Palpitations: Secondary | ICD-10-CM

## 2020-01-03 DIAGNOSIS — R030 Elevated blood-pressure reading, without diagnosis of hypertension: Secondary | ICD-10-CM

## 2020-01-03 NOTE — Progress Notes (Signed)
Primary Physician:  Maudie Flakes, FNP   Patient ID: Chelsea Frost, female    DOB: 21-May-1981, 39 y.o.   MRN: [id number]  Subjective:    Chief Complaint  Patient presents with  . Palpitations    follow up    HPI: Chelsea Frost  is a 39 y.o. female  with history of gestational diabetes, mild to moderate obesity, chronic migraine headaches, essential hypertension, and inappropriate sinus tachycardia. Symptoms have improved since being on Metoprolol.   She is here for 4 month follow up for palpitations. She has not had any recurrence of palpitations. No further chest pain. She has resumed walking and tolerates this well. She has not had any significant palpitations. States that she feels much better. Prior to her symptoms starting, she had potentially had viral illness.   She has not been monitoring her blood pressure. She admits to missing a dose of her metoprolol last night.  She is on Saxenda for weight loss. Has had 65 lb weight loss. Has Mirena in place for [facility].   Past Medical History:  Diagnosis Date  . Anxiety   . Eczema   . GERD (gastroesophageal reflux disease)   . Gestational diabetes    glyburide  . Gestational diabetes mellitus (GDM), antepartum   . Hx of varicella   . Hypertension    labetolol 200 mg bid  . Migraine     Past Surgical History:  Procedure Laterality Date  . BREAST REDUCTION SURGERY    . KNEE SURGERY Left   . WISDOM TOOTH EXTRACTION      Social History   Socioeconomic History  . Marital status: Married    Spouse name: Not on file  . Number of children: 2  . Years of education: Masters  . Highest education level: Not on file  Occupational History  . Occupation: Radiation protection practitioner  Tobacco Use  . Smoking status: Never Smoker  . Smokeless tobacco: Never Used  Substance and Sexual Activity  . Alcohol use: Yes    Comment: Occasional use  . Drug use: No  . Sexual activity: Not on file  Other Topics Concern  . Not  on file  Social History Narrative   Lives at home with her husband and child.   Right-handed.   Two 12oz bottles of Diet Dr. Reino Kent per day.   Social Determinants of Health   Financial Resource Strain:   . Difficulty of Paying Living Expenses: Not on file  Food Insecurity:   . Worried About Programme researcher, broadcasting/film/video in the Last Year: Not on file  . Ran Out of Food in the Last Year: Not on file  Transportation Needs:   . Lack of Transportation (Medical): Not on file  . Lack of Transportation (Non-Medical): Not on file  Physical Activity:   . Days of Exercise per Week: Not on file  . Minutes of Exercise per Session: Not on file  Stress:   . Feeling of Stress : Not on file  Social Connections:   . Frequency of Communication with Friends and Family: Not on file  . Frequency of Social Gatherings with Friends and Family: Not on file  . Attends Religious Services: Not on file  . Active Member of Clubs or Organizations: Not on file  . Attends Banker Meetings: Not on file  . Marital Status: Not on file  Intimate Partner Violence:   . Fear of Current or Ex-Partner: Not on file  . Emotionally Abused: Not on  file  . Physically Abused: Not on file  . Sexually Abused: Not on file    Review of Systems  Constitution: Negative for decreased appetite, malaise/fatigue, weight gain and weight loss.  Eyes: Negative for visual disturbance.  Cardiovascular: Negative for chest pain, claudication, dyspnea on exertion, leg swelling, orthopnea, palpitations and syncope.  Respiratory: Negative for hemoptysis and wheezing.   Endocrine: Negative for cold intolerance and heat intolerance.  Hematologic/Lymphatic: Does not bruise/bleed easily.  Skin: Negative for nail changes.  Musculoskeletal: Negative for muscle weakness and myalgias.  Gastrointestinal: Negative for abdominal pain, change in bowel habit, nausea and vomiting.  Neurological: Negative for difficulty with concentration, dizziness,  focal weakness and headaches.  Psychiatric/Behavioral: Negative for altered mental status and suicidal ideas.  All other systems reviewed and are negative.     Objective:  Blood pressure (!) 127/92, pulse 76, temperature 98.7 F (37.1 C), height 5\' 3"  (1.6 m), weight 164 lb (74.4 kg), SpO2 100 %, unknown if currently breastfeeding. Body mass index is 29.05 kg/m.    Physical Exam  Constitutional: She is oriented to person, place, and time. Vital signs are normal. She appears well-developed and well-nourished.  HENT:  Head: Normocephalic and atraumatic.  Cardiovascular: Normal rate, regular rhythm, normal heart sounds and intact distal pulses.  Pulmonary/Chest: Effort normal and breath sounds normal. No accessory muscle usage. No respiratory distress.  Abdominal: Soft. Bowel sounds are normal.  Musculoskeletal:        General: Normal range of motion.     Cervical back: Normal range of motion.  Neurological: She is alert and oriented to person, place, and time.  Skin: Skin is warm and dry.  Vitals reviewed.  Radiology: No results found.  Laboratory examination:    CMP Latest Ref Rng & Units 03/10/2018 02/28/2018 01/13/2018  Glucose 65 - 99 mg/dL 99 94 99  BUN 6 - 20 mg/dL 9 6 5(L)  Creatinine 01/15/2018 - 1.00 mg/dL 0.73 7.10 6.26  Sodium 135 - 145 mmol/L 136 135 129(L)  Potassium 3.5 - 5.1 mmol/L 4.3 3.9 3.2(L)  Chloride 101 - 111 mmol/L 106 107 102  CO2 22 - 32 mmol/L 21(L) 19(L) 19(L)  Calcium 8.9 - 10.3 mg/dL 9.3 9.48) 8.3(L)  Total Protein 6.5 - 8.1 g/dL 6.6 6.8 5.4(O)  Total Bilirubin 0.3 - 1.2 mg/dL 0.3 2.7(O) 0.7  Alkaline Phos 38 - 126 U/L 151(H) 119 80  AST 15 - 41 U/L 17 14(L) 14(L)  ALT 14 - 54 U/L 9(L) 9(L) 10(L)   CBC Latest Ref Rng & Units 03/12/2018 03/10/2018 02/28/2018  WBC 4.0 - 10.5 K/uL 7.4 9.2 8.6  Hemoglobin 12.0 - 15.0 g/dL 04/30/2018) 10.2(L) 9.8(L)  Hematocrit 36.0 - 46.0 % 28.9(L) 30.4(L) 28.7(L)  Platelets 150 - 400 K/uL 209 247 238   Lipid Panel  No results  found for: CHOL, TRIG, HDL, CHOLHDL, VLDL, LDLCALC, LDLDIRECT HEMOGLOBIN A1C No results found for: HGBA1C, MPG TSH No results for input(s): TSH in the last 8760 hours.  PRN Meds:. There are no discontinued medications. Current Meds  Medication Sig  . B Complex Vitamins (B COMPLEX PO) Take 1 tablet by mouth daily.  . busPIRone (BUSPAR) 5 MG tablet Take 5 mg by mouth 2 (two) times daily. 7.5 in morning and 5 mg at night  . butalbital-acetaminophen-caffeine (FIORICET, ESGIC) 50-325-40 MG tablet TAKE 1 TABLET BY MOUTH EVERY 6 HOURS AS NEEDED FOR HEADACHE  . citalopram (CELEXA) 40 MG tablet Take by mouth.  . Famotidine (PEPCID PO) Take 1 tablet  by mouth daily.  Marland Kitchen levocetirizine (XYZAL) 5 MG tablet Take 5 mg by mouth every evening.  Marland Kitchen levonorgestrel (MIRENA) 20 MCG/24HR IUD 1 each by Intrauterine route once.  . magnesium oxide (MAG-OX) 400 MG tablet Take 200 mg by mouth daily.  . metaxalone (SKELAXIN) 800 MG tablet Take 1 tablet (800 mg total) by mouth as needed for muscle spasms.  . metoprolol succinate (TOPROL-XL) 25 MG 24 hr tablet Take 1 tablet (25 mg total) by mouth daily. Take with or immediately following a meal. (Patient taking differently: Take 12.5 mg by mouth daily. Take with or immediately following a meal.)  . naratriptan (AMERGE) 2.5 MG tablet Take 1 tablet (2.5 mg total) by mouth as needed for migraine. Take one (1) tablet at onset of headache; if returns or does not resolve, may repeat after 4 hours; do not exceed five (5) mg in 24 hours.  . ondansetron (ZOFRAN-ODT) 4 MG disintegrating tablet Take 1 tablet (4 mg total) by mouth every 8 (eight) hours as needed.  Marland Kitchen SAXENDA 18 MG/3ML SOPN Inject 3 mg into the skin daily.  Marland Kitchen topiramate (TOPAMAX) 100 MG tablet Take 1 tablet (100 mg total) by mouth 2 (two) times daily.    Cardiac Studies:   Treadmill Exercise Stress   07/19/2019: Resting EKG normal sinus rhythm.  Stress EKG negative for myocardial ischemia. Chest pain is not  present. The patient exercised for 15 minutes and 59 seconds on Bruce protocol;  Stage 3 held for 10 min per patient request. achieved 8.75 METs at 86% of maximum predicted heart rate.  Normal exercise tolerance.  Normal blood pressure response.  No inducible arrhythmias.  Impression: Negative treadmill exercise stress test using Bruce protocol, stage III held for 10 minutes per patient request, total exercise duration 15:59 min, Mets achieved 8.75.  Echo 06/29/2019:  LVEF 55-60%. Normal LV wall thickness and size. No wall motion abnormalities. Aortic valve is trileaflet,, no stenosis or regurgitation. Trace MR. Trace TR. Trace PR. RVSP normal.   Holter monitor 06/29/2019: Patient remained in sinus rhythm slowest heart rate 52 at 4:15 AM on day 2, maximum heart rate 145 bpm at 8:49 PM on day 2.  Average heart rate 83 bpm.  Sinus arrhythmia was noted with heart rate 52 bpm at 4:15 AM on September 4.    139 episodes of sustained sinus tachycardia noted with total duration 299.5 minutes representing 10.6% of total time.  The fastest episode of tachycardia with heart rate 126 bpm. The longest episode lasted 12 minutes.    5 episodes of sustained sinus bradycardia noted with total duration 4.1 minutes representing less than 1% total time.  The slowest episode of bradycardia with heart rate 58 bpm. The longest episode lasted 1 minute.    190 isolated PVCs versus aberrantly conducted PACs noted.    Longest R-R interval 1.38 seconds at 4:15 AM on September 4.   1.51 seconds R to R interval was noted at 1:45 AM on day 2, but it was compensatory pause after PVC versus aberrantly conducted PAC.    No SVT, no A. fib, no VT, no long pauses 3 seconds or more noted.  Echocardiogram 01/27/2018: Left ventricle cavity is normal in size. Mild concentric hypertrophy of the left ventricle. Normal global wall motion. Calculated EF 55%. Left atrial cavity is mildly dilated at 4.4  cm. Structurally normal mitral valve with trace regurgitation. Structurally normal tricuspid valve with trace regurgitation.  Holter Monitor 48 hours 01/05/2018: Predominant rhythm is normal sinus rhythm.  Rare PACs and PVCs. Symptoms of palpitations reveal normal sinus rhythm.  Assessment:   Palpitations - Plan: EKG 12-Lead  Elevated blood pressure reading without diagnosis of hypertension  EKG 01/03/2020: Normal sinus rhythm at 71 bpm, normal axis, no evidence of ischemia. Normal EKG  Recommendations:   Patient is here for 24-month follow-up for palpitations and inappropriate sinus tachycardia.  She has been doing well over the last several months sitting on metoprolol.  She has not had any further episodes of palpitations or elevated heart rate.  No further chest pain.  We will continue with low-dose metoprolol.  Encouraged her to continue with adequate hydration and regular exercise.  Her blood pressure is elevated in our office today, but has generally been well controlled and stable.  I have encouraged her to monitor for the next 2 weeks and to send [place] a message with her blood pressure readings for evaluation.  Will consider further increasing her metoprolol if continues to be elevated.  I suspect her missing a dose of her metoprolol has contributed to slight elevation in her blood pressure today. She will also be seeing her PCP in the next 1-2 months and will have this reevaluated at that time. We will plan to see her back in 1 year, but encouraged her to contact me sooner if needed.  Toniann Fail, MSN, APRN, FNP-C [facility] [place] Office: [phone] Fax: [phone]

## 2020-02-12 ENCOUNTER — Other Ambulatory Visit: Payer: Self-pay | Admitting: Cardiology

## 2020-05-02 ENCOUNTER — Other Ambulatory Visit: Payer: Self-pay | Admitting: Neurology

## 2020-05-27 ENCOUNTER — Other Ambulatory Visit: Payer: Self-pay | Admitting: Neurology

## 2020-05-29 ENCOUNTER — Telehealth: Payer: Self-pay | Admitting: Neurology

## 2020-05-29 MED ORDER — TOPIRAMATE 100 MG PO TABS: 100.0000 mg | ORAL_TABLET | Freq: Two times a day (BID) | ORAL | 0 refills | Status: DC

## 2020-05-29 MED ORDER — ONDANSETRON 4 MG PO TBDP: 4.0000 mg | ORAL_TABLET | Freq: Three times a day (TID) | ORAL | 2 refills | Status: DC | PRN

## 2020-05-29 MED ORDER — METAXALONE 800 MG PO TABS: 800.0000 mg | ORAL_TABLET | ORAL | 2 refills | Status: DC | PRN

## 2020-05-29 NOTE — Telephone Encounter (Signed)
Pt has scheduled follow up on 08/29/20. Refill sent to last until this appt.

## 2020-05-29 NOTE — Telephone Encounter (Signed)
Pt request refills for metaxalone (SKELAXIN) 800 MG tablet, ondansetron (ZOFRAN-ODT) 4 MG disintegrating tablet,topiramate (TOPAMAX) 100 MG tablet at [facility] [phone]

## 2020-07-08 ENCOUNTER — Other Ambulatory Visit: Payer: Self-pay

## 2020-08-01 DIAGNOSIS — R030 Elevated blood-pressure reading, without diagnosis of hypertension: Secondary | ICD-10-CM

## 2020-08-01 DIAGNOSIS — R002 Palpitations: Secondary | ICD-10-CM

## 2020-08-01 NOTE — Telephone Encounter (Signed)
From pt

## 2020-08-19 ENCOUNTER — Other Ambulatory Visit: Payer: Self-pay | Admitting: Neurology

## 2020-08-22 MED ORDER — VERAPAMIL HCL 40 MG PO TABS: 40.0000 mg | ORAL_TABLET | Freq: Two times a day (BID) | ORAL | 1 refills | Status: DC

## 2020-08-22 NOTE — Telephone Encounter (Signed)
I have discontinued metoprolol succinate as she continues to have palpitations, will try verapamil plain 40 mg twice daily plus additional 1 tablet as needed for palpitations.  She has an upcoming appointment soon.    ICD-10-CM   1. Palpitations  R00.2 verapamil (CALAN) 40 MG tablet  2. Elevated blood pressure reading without diagnosis of hypertension  R03.0 verapamil (CALAN) 40 MG tablet     Yates Decamp, MD, [facility] 08/22/2020, 12:46 PM Office: [phone] Pager: [phone]

## 2020-08-29 ENCOUNTER — Encounter: Payer: Self-pay | Admitting: Neurology

## 2020-08-29 ENCOUNTER — Ambulatory Visit: Payer: BC Managed Care – PPO | Admitting: Neurology

## 2020-08-29 VITALS — BP 106/77 | HR 85 | Ht 63.0 in | Wt 148.6 lb

## 2020-08-29 DIAGNOSIS — G43709 Chronic migraine without aura, not intractable, without status migrainosus: Secondary | ICD-10-CM

## 2020-08-29 MED ORDER — NARATRIPTAN HCL 2.5 MG PO TABS: 2.5000 mg | ORAL_TABLET | ORAL | 11 refills | Status: DC | PRN

## 2020-08-29 MED ORDER — BUTALBITAL-APAP-CAFFEINE 50-325-40 MG PO TABS: ORAL_TABLET | ORAL | 1 refills | Status: DC

## 2020-08-29 MED ORDER — TOPIRAMATE 100 MG PO TABS: 100.0000 mg | ORAL_TABLET | Freq: Two times a day (BID) | ORAL | 4 refills | Status: DC

## 2020-08-29 MED ORDER — METAXALONE 800 MG PO TABS: 800.0000 mg | ORAL_TABLET | ORAL | 3 refills | Status: DC | PRN

## 2020-08-29 NOTE — Patient Instructions (Signed)
Try to decrease dose of Topamax 50 mg in the morning, keep 100 mg evening, if headaches continue to do well, may eliminate morning dose  Keep other medications See you back in 1 year

## 2020-08-29 NOTE — Progress Notes (Signed)
PATIENT: Chelsea Frost DOB: [birth year]  REASON FOR VISIT: follow up HISTORY FROM: patient  HISTORY OF PRESENT ILLNESS: Today 08/29/20  HISTORY  Chelsea Frost, is a 39 year old female, seen in refer by her primary care doctor Devra Dopp for evaluation of migraine headaches, initial evaluation was on July 15 2017.  Reviewed and summarized the referring note, she had a history of obesity, impaired glucose intolerance, Vitamin D deficiency, anxiety disorder, acid reflux, chronic migraine headaches,  She had migraine headaches since twenties, her typical migraine starting from neck, spreading forward, become severe pounding headache, with high-pitched noise, smell sensitivity, nauseous, mild light sensitivity, movement make her headache worse,  She was under the care of headache and neck pain clinic by Dr.Lewit, most recent visit was on Mar 24 2017.  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 She has been taking Fioricet about twice a week,  She has abnormal glucose during  the pregnancy, is taking saxenda now.  Previously Topamax worked well as migraine prevention, Amerge works as migraine abortive treatment  UPDATE June 9th 2020: She continues to have occasionally headaches, but much improved, couple times each month, tolerating Topamax 200 mg daily, taking Amerge as needed, which usually take away her headache in 1 hour, sometimes need combination of Skelaxin, and NSAIDs,  Update August 29, 2020 SS: Here today for follow-up, migraines currently well controlled, 1 migraine every 3 to 4 months.  Remains on Topamax 100 mg twice a day, has noted some memory loss, wonders about dropping the dose.  Having some palpitations, cardiology has started verapamil 80 mg daily.  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  Take Skelaxin for tension. Over years has done PT, works for a while, then wears off, right shoulder lower than left with tension.  For migraine headache, will take Amerge, for moderate headache, takes Fioricet.  REVIEW OF SYSTEMS: Out of a complete 14 system review of symptoms, the patient complains only of the following symptoms, and all other reviewed systems are negative.  Headache  ALLERGIES: Allergies  Allergen Reactions   Imitrex [Sumatriptan] Other (See Comments)    Jaw issues    Amoxicillin Rash    Has patient had a PCN reaction causing immediate rash, facial/tongue/throat swelling, SOB or lightheadedness with hypotension: No Has patient had a PCN reaction causing severe rash involving mucus membranes or skin necrosis: No Has patient had a PCN reaction that required hospitalization: No Has patient had a PCN reaction occurring within the last 10 years: Yes If all of the above answers are "NO", then  may proceed with Cephalosporin use.     HOME MEDICATIONS: Outpatient Medications Prior to Visit  Medication Sig Dispense Refill     B Complex Vitamins (B COMPLEX PO) Take 1 tablet by mouth daily.     busPIRone (BUSPAR) 5 MG tablet Take 5 mg by mouth 2 (two) times daily. 7.5 in morning and 5 mg at night     Famotidine (PEPCID PO) Take 1 tablet by mouth daily.     FLUoxetine (PROZAC) 10 MG tablet Take 10 mg by mouth.     FLUoxetine (PROZAC) 20 MG capsule Take 20 mg by mouth daily.     levocetirizine (XYZAL) 5 MG tablet Take 5 mg by mouth every evening.     levonorgestrel (MIRENA) 20 MCG/24HR IUD 1 each by Intrauterine route once.     ondansetron (ZOFRAN-ODT) 4 MG disintegrating tablet Take 1 tablet (4 mg total) by mouth every 8 (eight) hours as needed. #20/month 20 tablet 2   SAXENDA 18 MG/3ML SOPN Inject 3 mg into the skin daily.  11   verapamil (CALAN) 40 MG tablet Take 1 tablet (40 mg total) by mouth 2 (two) times daily. Can take one extra if needed for palpitations 90 tablet 1   butalbital-acetaminophen-caffeine (FIORICET, ESGIC) 50-325-40 MG tablet TAKE 1 TABLET BY MOUTH EVERY 6 HOURS AS NEEDED FOR HEADACHE 15 tablet 5   metaxalone (SKELAXIN) 800 MG tablet Take 1 tablet (800 mg total) by mouth as needed for muscle spasms. #30/month 30 tablet 2   naratriptan (AMERGE) 2.5 MG tablet Take 1 tablet (2.5 mg total) by mouth as needed for migraine. Take one (1) tablet at onset of headache; if returns or does not resolve, may repeat after 4 hours; do not exceed five (5) mg in 24 hours. 12 tablet 11   topiramate (TOPAMAX) 100 MG tablet TAKE 1 TABLET BY MOUTH TWICE A DAY 180 tablet 0   citalopram (CELEXA) 40 MG tablet Take by mouth.     magnesium oxide (MAG-OX) 400 MG tablet Take 200 mg by mouth daily.     No facility-administered medications prior to visit.    PAST MEDICAL HISTORY: Past Medical History:  Diagnosis Date   Anxiety    Eczema    GERD (gastroesophageal reflux disease)    Gestational diabetes    glyburide   Gestational diabetes mellitus (GDM), antepartum    Hx of varicella     Hypertension    labetolol 200 mg bid   Migraine     PAST SURGICAL HISTORY: Past Surgical History:  Procedure Laterality Date   BREAST REDUCTION SURGERY     KNEE SURGERY Left    WISDOM TOOTH EXTRACTION      FAMILY HISTORY: Family History  Problem Relation Age of Onset   Lupus Father        lupus anticoagulant   Pulmonary embolism Father    Hiatal hernia Father    Arrhythmia Father    Deep vein thrombosis Father    Migraines Father    GER disease Father    Multiple sclerosis Paternal Aunt    Lupus Paternal Aunt    COPD Maternal Grandmother    Cancer Maternal Grandfather        colon   Hypertension Mother    ADD / ADHD Sister     SOCIAL HISTORY: Social History   Socioeconomic History   Marital status: Married    Spouse name: Not on file   Number of children: 2   Years of education: Masters  Highest education level: Not on file  Occupational History   Occupation: Radiation protection practitioner  Tobacco Use   Smoking status: Never Smoker   Smokeless tobacco: Never Used  Vaping Use   Vaping Use: Never used  Substance and Sexual Activity   Alcohol use: Yes    Comment: Occasional use   Drug use: No   Sexual activity: Not on file  Other Topics Concern   Not on file  Social History Narrative   Lives at home with her husband and child.   Right-handed.   Two 12oz bottles of Diet Dr. Reino Kent per day.   Social Determinants of Health   Financial Resource Strain:    Difficulty of Paying Living Expenses: Not on file  Food Insecurity:    Worried About Programme researcher, broadcasting/film/video in the Last Year: Not on file   [employer] of Food in the Last Year: Not on file  Transportation Needs:    Lack of Transportation (Medical): Not on file   Lack of Transportation (Non-Medical): Not on file  Physical Activity:    Days of Exercise per Week: Not on file   Minutes of Exercise per Session: Not on file  Stress:    Feeling of Stress : Not on file    Social Connections:    Frequency of Communication with Friends and Family: Not on file   Frequency of Social Gatherings with Friends and Family: Not on file   Attends Religious Services: Not on file   Active Member of Clubs or Organizations: Not on file   Attends Banker Meetings: Not on file   Marital Status: Not on file  Intimate Partner Violence:    Fear of Current or Ex-Partner: Not on file   Emotionally Abused: Not on file   Physically Abused: Not on file   Sexually Abused: Not on file   PHYSICAL EXAM  Vitals:   08/29/20 0740  BP: 106/77  Pulse: 85  Weight: 148 lb 9.6 oz (67.4 kg)  Height: 5\' 3"  (1.6 m)   Body mass index is 26.32 kg/m.  Generalized: Well developed, in no acute distress   Neurological examination  Mentation: Alert oriented to time, place, history taking. Follows all commands speech and language fluent Cranial nerve II-XII: Pupils were equal round reactive to light. Extraocular movements were full, visual field were full on confrontational test. Facial sensation and strength were normal. Is sitting posture, right shoulder lower then left.  Motor: The motor testing reveals 5 over 5 strength of all 4 extremities. Good symmetric motor tone is noted throughout.  Sensory: Sensory testing is intact to soft touch on all 4 extremities. No evidence of extinction is noted.  Coordination: Cerebellar testing reveals good finger-nose-finger and heel-to-shin bilaterally.  Gait and station: Gait is normal.  Reflexes: Deep tendon reflexes are symmetric and normal bilaterally.   DIAGNOSTIC DATA (LABS, IMAGING, TESTING) - I reviewed patient records, labs, notes, testing and imaging myself where available.  Lab Results  Component Value Date   WBC 7.4 03/12/2018   HGB 9.6 (L) 03/12/2018   HCT 28.9 (L) 03/12/2018   MCV 88.7 03/12/2018   PLT 209 03/12/2018      Component Value Date/Time   NA 136 03/10/2018 2333   K 4.3 03/10/2018 2333   CL 106  03/10/2018 2333   CO2 21 (L) 03/10/2018 2333   GLUCOSE 99 03/10/2018 2333   BUN 9 03/10/2018 2333   CREATININE 0.58 03/10/2018 2333   CALCIUM 9.3 03/10/2018 2333  PROT 6.6 03/10/2018 2333   ALBUMIN 3.1 (L) 03/10/2018 2333   AST 17 03/10/2018 2333   ALT 9 (L) 03/10/2018 2333   ALKPHOS 151 (H) 03/10/2018 2333   BILITOT 0.3 03/10/2018 2333   GFRNONAA >60 03/10/2018 2333   GFRAA >60 03/10/2018 2333   No results found for: CHOL, HDL, LDLCALC, LDLDIRECT, TRIG, CHOLHDL No results found for: [id number] No results found for: VITAMINB12 No results found for: TSH  ASSESSMENT AND PLAN 39 y.o. year old female  has a past medical history of Anxiety, Eczema, GERD (gastroesophageal reflux disease), Gestational diabetes, Gestational diabetes mellitus (GDM), antepartum, varicella, Hypertension, and Migraine. here with:  1.  Chronic migraine headaches -Currently under excellent control, 1 migraine every 3 to 4 months -May try to back off Topamax, 50 mg am/100 mg PM, side effect of memory loss, if does well, may just take 100 mg at bedtime, resume if headaches return -Continue Amerge, Fioricet for acute migraine, may combine with Skelaxin, naproxen, Zofran for prolonged severe headaches -For cervical tension/shoulders, recommend heat, massage, stretching, exercise as tolerated -Follow-up in 1 year or sooner if needed  I spent 20 minutes of face-to-face and non-face-to-face time with patient.  This included previsit chart review, lab review, study review, order entry, electronic health record documentation, patient education.  Margie EgeSarah Morey Andonian, AGNP-C, DNP 08/29/2020, 8:04 AM [facility] [address], [address] [place], [place] [address] [phone]

## 2020-10-30 ENCOUNTER — Other Ambulatory Visit: Payer: BC Managed Care – PPO

## 2020-11-13 ENCOUNTER — Other Ambulatory Visit: Payer: Self-pay | Admitting: Cardiology

## 2020-11-13 DIAGNOSIS — R030 Elevated blood-pressure reading, without diagnosis of hypertension: Secondary | ICD-10-CM

## 2020-11-13 DIAGNOSIS — R002 Palpitations: Secondary | ICD-10-CM

## 2020-11-15 ENCOUNTER — Other Ambulatory Visit: Payer: Self-pay | Admitting: Cardiology

## 2020-11-15 DIAGNOSIS — R002 Palpitations: Secondary | ICD-10-CM

## 2020-11-15 DIAGNOSIS — R030 Elevated blood-pressure reading, without diagnosis of hypertension: Secondary | ICD-10-CM

## 2021-01-01 NOTE — Progress Notes (Signed)
Primary Physician:  [facility]   Patient ID: Chelsea Frost, female    DOB: [birth year], 40 y.o.   MRN: [id number]  Subjective:    Chief Complaint  Patient presents with  . PVC  . Follow-up    1 year f/u   . Shortness of Breath    HPI: Chelsea Frost  is a 40 y.o. female  with history of gestational diabetes, mild to moderate obesity, chronic migraine headaches, essential hypertension, and inappropriate sinus tachycardia.  Patient symptoms previously well controlled on metoprolol, however she had recurrence of palpitations and therefore was switched from metoprolol to verapamil 40 mg daily with up to 2 additional tablet as needed for palpitations.   Patient presents for annual follow-up of PVCs and inappropriate sinus tachycardia.  Patient continues to have occasional episodes of palpitations, which seem to be worse when allergies are acting up.  Patient resting heart symptoms are relatively controlled with verapamil 40 mg daily, she takes additional dose of verapamil as needed palpitations.  Overall symptoms are stable and well controlled.  She does report mild shortness of breath associated with increased frequency of palpitations, however this improves when palpitations improve.  Patient has 2 young children and works full-time, therefore she struggles to maintain a regular exercise routine, but is relatively active.  She remains on Saxenda for weight loss, however notably her weight has increased since last visit in our office.  Patient admits to "stress eating" lately.  Notably patient has also had migraines the last 2 nights and is having back pain at this time, which may be contributing to patient's elevated blood pressure in our office today.  She denies chest pain, syncope, near syncope, dizziness.   Past Medical History:  Diagnosis Date  . Anxiety   . Eczema   . GERD (gastroesophageal reflux disease)   . Gestational diabetes    glyburide   . Gestational diabetes mellitus (GDM), antepartum   . Hx of varicella   . Hypertension    labetolol 200 mg bid  . Migraine     Past Surgical History:  Procedure Laterality Date  . BREAST REDUCTION SURGERY    . KNEE SURGERY Left   . WISDOM TOOTH EXTRACTION     Social History   Tobacco Use  . Smoking status: Never Smoker  . Smokeless tobacco: Never Used  Substance Use Topics  . Alcohol use: Yes    Comment: Occasional use   Marital status: Married   ROS:   Review of Systems  Constitutional: Negative for decreased appetite, malaise/fatigue, weight gain and weight loss.  Eyes: Negative for visual disturbance.  Cardiovascular: Negative for chest pain, claudication, leg swelling, near-syncope, orthopnea, palpitations, paroxysmal nocturnal dyspnea and syncope.  Respiratory: Positive for shortness of breath (occasional brief ). Negative for hemoptysis and wheezing.   Endocrine: Negative for cold intolerance and heat intolerance.  Hematologic/Lymphatic: Does not bruise/bleed easily.  Skin: Negative for nail changes.  Musculoskeletal: Negative for muscle weakness and myalgias.  Gastrointestinal: Negative for abdominal pain, change in bowel habit, melena, nausea and vomiting.  Neurological: Negative for difficulty with concentration, dizziness, focal weakness, headaches and weakness.  Psychiatric/Behavioral: Negative for altered mental status and suicidal ideas.  All other systems reviewed and are negative.     Objective:  Blood pressure 129/84, pulse 95, height 5' 3"  (1.6 m), weight 161 lb (73 kg), SpO2 98 %, unknown if currently breastfeeding. Body mass index is 28.52 kg/m.    Physical Exam Vitals reviewed.  Constitutional:      Appearance: She is well-developed.  HENT:     Head: Normocephalic and atraumatic.  Cardiovascular:     Rate and Rhythm: Normal rate and regular rhythm.     Pulses: Intact distal pulses.          Carotid pulses are 2+ on the right side and 2+ on  the left side.      Radial pulses are 2+ on the right side and 2+ on the left side.       Popliteal pulses are 2+ on the right side and 2+ on the left side.       Dorsalis pedis pulses are 2+ on the right side and 2+ on the left side.       Posterior tibial pulses are 2+ on the right side and 2+ on the left side.     Heart sounds: Normal heart sounds, S1 normal and S2 normal. No murmur heard. No gallop.   Pulmonary:     Effort: Pulmonary effort is normal. No accessory muscle usage or respiratory distress.     Breath sounds: Normal breath sounds. No wheezing, rhonchi or rales.  Abdominal:     General: Bowel sounds are normal.     Palpations: Abdomen is soft.  Musculoskeletal:        General: Normal range of motion.     Cervical back: Normal range of motion.     Right lower leg: No edema.     Left lower leg: No edema.  Skin:    General: Skin is warm and dry.  Neurological:     Mental Status: She is alert and oriented to person, place, and time.    Laboratory examination:    CMP Latest Ref Rng & Units 03/10/2018 02/28/2018 01/13/2018  Glucose 65 - 99 mg/dL 99 94 99  BUN 6 - 20 mg/dL 9 6 5(L)  Creatinine 0.44 - 1.00 mg/dL 0.58 0.44 0.61  Sodium 135 - 145 mmol/L 136 135 129(L)  Potassium 3.5 - 5.1 mmol/L 4.3 3.9 3.2(L)  Chloride 101 - 111 mmol/L 106 107 102  CO2 22 - 32 mmol/L 21(L) 19(L) 19(L)  Calcium 8.9 - 10.3 mg/dL 9.3 8.8(L) 8.3(L)  Total Protein 6.5 - 8.1 g/dL 6.6 6.8 6.2(L)  Total Bilirubin 0.3 - 1.2 mg/dL 0.3 0.2(L) 0.7  Alkaline Phos 38 - 126 U/L 151(H) 119 80  AST 15 - 41 U/L 17 14(L) 14(L)  ALT 14 - 54 U/L 9(L) 9(L) 10(L)   CBC Latest Ref Rng & Units 03/12/2018 03/10/2018 02/28/2018  WBC 4.0 - 10.5 K/uL 7.4 9.2 8.6  Hemoglobin 12.0 - 15.0 g/dL 9.6(L) 10.2(L) 9.8(L)  Hematocrit 36.0 - 46.0 % 28.9(L) 30.4(L) 28.7(L)  Platelets 150 - 400 K/uL 209 247 238   Lipid Panel  No results found for: CHOL, TRIG, HDL, CHOLHDL, VLDL, LDLCALC, LDLDIRECT HEMOGLOBIN A1C No results  found for: HGBA1C, MPG TSH No results for input(s): TSH in the last 8760 hours.  External labs:  02/01/2020: TSH 1.08 Hemoglobin 13.3, hematocrit 39.4, MCV 93, platelets 247 A1c 4.6% Total cholesterol 150, triglycerides 98, HDL 48, LDL 84 Glucose 80, BUN 10, creatinine 0.81, GFR 94, sodium 138, potassium 4.2, AST 12, ALT 9, alk phos 48   Allergies and Medications:   Allergies  Allergen Reactions  . Imitrex [Sumatriptan] Other (See Comments)    Jaw issues   . Amoxicillin Rash    Has patient had a PCN reaction causing immediate rash, facial/tongue/throat swelling, SOB or lightheadedness with  hypotension: No Has patient had a PCN reaction causing severe rash involving mucus membranes or skin necrosis: No Has patient had a PCN reaction that required hospitalization: No Has patient had a PCN reaction occurring within the last 10 years: Yes If all of the above answers are "NO", then may proceed with Cephalosporin use.    Current Outpatient Medications  Medication Instructions  . busPIRone (BUSPAR) 5 mg, Oral, 2 times daily, 7.5 in morning and 5 mg at night  . butalbital-acetaminophen-caffeine (FIORICET) 50-325-40 MG tablet TAKE 1 TABLET BY MOUTH EVERY 6 HOURS AS NEEDED FOR HEADACHE  . FLUoxetine (PROZAC) 20 mg, Oral, Daily  . FLUoxetine (PROZAC) 10 mg, Oral  . levocetirizine (XYZAL) 5 mg, Oral, Every evening  . levonorgestrel (MIRENA) 20 MCG/24HR IUD 1 each, Intrauterine,  Once  . metaxalone (SKELAXIN) 800 mg, Oral, As needed, #30/month  . naratriptan (AMERGE) 2.5 mg, Oral, As needed, Take one (1) tablet at onset of headache; if returns or does not resolve, may repeat after 4 hours; do not exceed five (5) mg in 24 hours.  . ondansetron (ZOFRAN-ODT) 4 mg, Oral, Every 8 hours PRN, #20/month  . Saxenda 3 mg, Subcutaneous, Daily  . topiramate (TOPAMAX) 100 mg, Oral, 2 times daily  . verapamil (CALAN) 40 MG tablet PLEASE SEE ATTACHED FOR DETAILED DIRECTIONS   Radiology:  No results  found.  Cardiac Studies:   Treadmill Exercise Stress   07/19/2019: Resting EKG normal sinus rhythm.  Stress EKG negative for myocardial ischemia. Chest pain is not present. The patient exercised for 15 minutes and 59 seconds on Bruce protocol;  Stage 3 held for 10 min per patient request. achieved 8.75 METs at 86% of maximum predicted heart rate.  Normal exercise tolerance.  Normal blood pressure response.  No inducible arrhythmias.  Impression: Negative treadmill exercise stress test using Bruce protocol, stage III held for 10 minutes per patient request, total exercise duration 15:59 min, Mets achieved 8.75.  Echo 06/29/2019:  LVEF 55-60%. Normal LV wall thickness and size. No wall motion abnormalities. Aortic valve is trileaflet,, no stenosis or regurgitation. Trace MR. Trace TR. Trace PR. RVSP normal.   Holter monitor 06/29/2019: Patient remained in sinus rhythm slowest heart rate 52 at 4:15 AM on day 2, maximum heart rate 145 bpm at 8:49 PM on day 2.  Average heart rate 83 bpm.  Sinus arrhythmia was noted with heart rate 52 bpm at 4:15 AM on September 4.    139 episodes of sustained sinus tachycardia noted with total duration 299.5 minutes representing 10.6% of total time.  The fastest episode of tachycardia with heart rate 126 bpm. The longest episode lasted 12 minutes.    5 episodes of sustained sinus bradycardia noted with total duration 4.1 minutes representing less than 1% total time.  The slowest episode of bradycardia with heart rate 58 bpm. The longest episode lasted 1 minute.    190 isolated PVCs versus aberrantly conducted PACs noted.    Longest R-R interval 1.38 seconds at 4:15 AM on September 4.   1.51 seconds R to R interval was noted at 1:45 AM on day 2, but it was compensatory pause after PVC versus aberrantly conducted PAC.    No SVT, no A. fib, no VT, no long pauses 3 seconds or more noted.  Echocardiogram 01/27/2018: Left ventricle cavity is  normal in size. Mild concentric hypertrophy of the left ventricle. Normal global wall motion. Calculated EF 55%. Left atrial cavity is mildly dilated at 4.4 cm. Structurally normal  mitral valve with trace regurgitation. Structurally normal tricuspid valve with trace regurgitation.  Holter Monitor 48 hours 01/05/2018: Predominant rhythm is normal sinus rhythm. Rare PACs and PVCs. Symptoms of palpitations reveal normal sinus rhythm.   EKG:   EKG 01/02/2021: Sinus rhythm at a rate of 79 bpm.  Normal axis.  No evidence of ischemia or underlying injury pattern.  Compared to EKG 01/03/2020, no significant change.  Assessment:   Palpitations - Plan: EKG 12-Lead  Inappropriate sinus node tachycardia  Elevated blood pressure reading without diagnosis of hypertension No orders of the defined types were placed in this encounter.  Medications Discontinued During This Encounter  Medication Reason  . Famotidine (PEPCID PO) Completed Course  . B Complex Vitamins (B COMPLEX PO) Completed Course    Recommendations:   Chelsea Frost  is a 40 y.o. female  with history of gestational diabetes, mild to moderate obesity, chronic migraine headaches, essential hypertension, and inappropriate sinus tachycardia.  Patient symptoms previously well controlled on metoprolol, however she had recurrence of palpitations and therefore was switched from metoprolol to verapamil 40 mg daily with up to 2 additional tablet as needed for palpitations.   Patient presents for annual follow-up of PVC and inappropriate sinus tachycardia.  Patient does continue to have occasional episodes of palpitations, but overall symptom management has significantly improved with verapamil 40 mg daily and additional doses as needed.  She states when taking verapamil 40 mg twice daily she was feeling mildly fatigued.  Overall patient is happy with her symptom management at this time.  She has had no recurrence of chest pain and is otherwise  asymptomatic.   Counseled patient regarding weight loss, as well as diet and lifestyle modifications.  Also discussed that her blood pressure in the office today is borderline elevated, may be due to back pain at this time.  I have encouraged her to continue to monitor this and to notify our office if it remains elevated.  We will not make changes to her medications at this time.  I have personally reviewed external labs, lipids are well controlled.  Follow-up in 1 year, sooner if needed, for palpitations.   Alethia Berthold, PA-C 01/02/2021, 9:23 AM Office: [phone]

## 2021-01-02 ENCOUNTER — Ambulatory Visit: Payer: BC Managed Care – PPO | Admitting: Student

## 2021-01-02 ENCOUNTER — Encounter: Payer: Self-pay | Admitting: Student

## 2021-01-02 ENCOUNTER — Ambulatory Visit: Payer: BC Managed Care – PPO | Admitting: Cardiology

## 2021-01-02 ENCOUNTER — Other Ambulatory Visit: Payer: Self-pay

## 2021-01-02 VITALS — BP 129/84 | HR 95 | Ht 63.0 in | Wt 161.0 lb

## 2021-01-02 DIAGNOSIS — R030 Elevated blood-pressure reading, without diagnosis of hypertension: Secondary | ICD-10-CM

## 2021-01-02 DIAGNOSIS — R000 Tachycardia, unspecified: Secondary | ICD-10-CM

## 2021-01-02 DIAGNOSIS — R002 Palpitations: Secondary | ICD-10-CM

## 2021-01-15 ENCOUNTER — Other Ambulatory Visit: Payer: Self-pay | Admitting: Cardiology

## 2021-01-15 DIAGNOSIS — R030 Elevated blood-pressure reading, without diagnosis of hypertension: Secondary | ICD-10-CM

## 2021-01-15 DIAGNOSIS — R002 Palpitations: Secondary | ICD-10-CM

## 2021-06-23 ENCOUNTER — Other Ambulatory Visit: Payer: Self-pay | Admitting: Neurology

## 2021-06-23 ENCOUNTER — Telehealth: Payer: Self-pay | Admitting: Neurology

## 2021-06-23 ENCOUNTER — Emergency Department (HOSPITAL_BASED_OUTPATIENT_CLINIC_OR_DEPARTMENT_OTHER): Payer: BC Managed Care – PPO

## 2021-06-23 ENCOUNTER — Other Ambulatory Visit: Payer: Self-pay

## 2021-06-23 ENCOUNTER — Encounter (HOSPITAL_BASED_OUTPATIENT_CLINIC_OR_DEPARTMENT_OTHER): Payer: Self-pay | Admitting: Obstetrics and Gynecology

## 2021-06-23 DIAGNOSIS — Z8632 Personal history of gestational diabetes: Secondary | ICD-10-CM | POA: Diagnosis not present

## 2021-06-23 DIAGNOSIS — M542 Cervicalgia: Secondary | ICD-10-CM | POA: Diagnosis not present

## 2021-06-23 DIAGNOSIS — Z79899 Other long term (current) drug therapy: Secondary | ICD-10-CM | POA: Diagnosis not present

## 2021-06-23 DIAGNOSIS — Z20822 Contact with and (suspected) exposure to covid-19: Secondary | ICD-10-CM | POA: Diagnosis not present

## 2021-06-23 DIAGNOSIS — R202 Paresthesia of skin: Secondary | ICD-10-CM | POA: Insufficient documentation

## 2021-06-23 DIAGNOSIS — R519 Headache, unspecified: Secondary | ICD-10-CM | POA: Diagnosis not present

## 2021-06-23 DIAGNOSIS — M25512 Pain in left shoulder: Secondary | ICD-10-CM | POA: Diagnosis not present

## 2021-06-23 DIAGNOSIS — M5481 Occipital neuralgia: Secondary | ICD-10-CM | POA: Insufficient documentation

## 2021-06-23 DIAGNOSIS — I10 Essential (primary) hypertension: Secondary | ICD-10-CM | POA: Diagnosis not present

## 2021-06-23 LAB — URINALYSIS, ROUTINE W REFLEX MICROSCOPIC
Bilirubin Urine: NEGATIVE
Glucose, UA: NEGATIVE mg/dL
Hgb urine dipstick: NEGATIVE
Ketones, ur: 15 mg/dL — AB
Nitrite: NEGATIVE
Protein, ur: NEGATIVE mg/dL
Specific Gravity, Urine: <1.005 — ABNORMAL LOW (ref 1.005–1.030)
pH: 7.5 (ref 5.0–8.0)

## 2021-06-23 LAB — CBC WITH DIFFERENTIAL/PLATELET
Abs Immature Granulocytes: 0.02 10*3/uL (ref 0.00–0.07)
Basophils Absolute: 0 10*3/uL (ref 0.0–0.1)
Basophils Relative: 1 %
Eosinophils Absolute: 0 10*3/uL (ref 0.0–0.5)
Eosinophils Relative: 0 %
HCT: 38.3 % (ref 36.0–46.0)
Hemoglobin: 13.2 g/dL (ref 12.0–15.0)
Immature Granulocytes: 0 %
Lymphocytes Relative: 17 %
Lymphs Abs: 1 10*3/uL (ref 0.7–4.0)
MCH: 31.4 pg (ref 26.0–34.0)
MCHC: 34.5 g/dL (ref 30.0–36.0)
MCV: 91 fL (ref 80.0–100.0)
Monocytes Absolute: 0.4 10*3/uL (ref 0.1–1.0)
Monocytes Relative: 6 %
Neutro Abs: 4.5 10*3/uL (ref 1.7–7.7)
Neutrophils Relative %: 76 %
Platelets: 299 10*3/uL (ref 150–400)
RBC: 4.21 MIL/uL (ref 3.87–5.11)
RDW: 13.7 % (ref 11.5–15.5)
WBC: 6 10*3/uL (ref 4.0–10.5)
nRBC: 0 % (ref 0.0–0.2)

## 2021-06-23 LAB — COMPREHENSIVE METABOLIC PANEL
ALT: 13 U/L (ref 0–44)
AST: 18 U/L (ref 15–41)
Albumin: 4.3 g/dL (ref 3.5–5.0)
Alkaline Phosphatase: 43 U/L (ref 38–126)
Anion gap: 10 (ref 5–15)
BUN: 9 mg/dL (ref 6–20)
CO2: 23 mmol/L (ref 22–32)
Calcium: 9.3 mg/dL (ref 8.9–10.3)
Chloride: 106 mmol/L (ref 98–111)
Creatinine, Ser: 0.84 mg/dL (ref 0.44–1.00)
GFR, Estimated: >60 mL/min (ref >60)
Glucose, Bld: 101 mg/dL — ABNORMAL HIGH (ref 70–99)
Potassium: 3.3 mmol/L — ABNORMAL LOW (ref 3.5–5.1)
Sodium: 139 mmol/L (ref 135–145)
Total Bilirubin: 0.5 mg/dL (ref 0.3–1.2)
Total Protein: 7.7 g/dL (ref 6.5–8.1)

## 2021-06-23 LAB — PREGNANCY, URINE: Preg Test, Ur: NEGATIVE

## 2021-06-23 LAB — RESP PANEL BY RT-PCR (FLU A&B, COVID) ARPGX2
Influenza A by PCR: NEGATIVE
Influenza B by PCR: NEGATIVE
SARS Coronavirus 2 by RT PCR: NEGATIVE

## 2021-06-23 NOTE — Telephone Encounter (Signed)
Very severe acute onset headache, not like her other headaches, came "out of nowhere" at noon today, her face was numb and tingly when it started, she took amerge and skelaxin twice, also trazodone, neither meds have  touched the severe pain, her joints feel weak, she hasn't eaten, this is very unusual, worst headache of life. Not improved with any medications. I advised her to go directly to the ED, can call 911 for ambulance. Patient very concerned, says this is highly unusual and acute and persistent and severe.Again, call 911.

## 2021-06-23 NOTE — ED Triage Notes (Signed)
Patient reports she had a headache today that got worse. Patient states she took aleve, celestin, and was getting worse. Patient reports the pain got 10/10, she took an Emerge, and then a trazodone. Patient reports she then fell asleep and then woke up at approximately 1630. Patient reports she attempted to take a shower and that did not help. Patient reports she took another Emerge, Trazodone, and Celestin. Patient endorses tingling in her face, arm, and knees. Patient reports intermittent nausea.

## 2021-06-24 ENCOUNTER — Emergency Department (HOSPITAL_BASED_OUTPATIENT_CLINIC_OR_DEPARTMENT_OTHER)
Admission: EM | Admit: 2021-06-24 | Discharge: 2021-06-24 | Disposition: A | Discharge status: Home / Self Care | Payer: BC Managed Care – PPO | Attending: Emergency Medicine | Admitting: Emergency Medicine

## 2021-06-24 DIAGNOSIS — R519 Headache, unspecified: Secondary | ICD-10-CM

## 2021-06-24 MED ORDER — KETOROLAC TROMETHAMINE 15 MG/ML IJ SOLN
15.0000 mg | Freq: Once | INTRAMUSCULAR | Status: AC
  Administered 2021-06-24: 15 mg via INTRAVENOUS
  Filled 2021-06-24: qty 1

## 2021-06-24 MED ORDER — DIPHENHYDRAMINE HCL 50 MG/ML IJ SOLN
12.5000 mg | Freq: Once | INTRAMUSCULAR | Status: AC
  Administered 2021-06-24: 12.5 mg via INTRAVENOUS
  Filled 2021-06-24: qty 1

## 2021-06-24 MED ORDER — PROCHLORPERAZINE EDISYLATE 10 MG/2ML IJ SOLN
10.0000 mg | Freq: Once | INTRAMUSCULAR | Status: AC
  Administered 2021-06-24: 10 mg via INTRAVENOUS
  Filled 2021-06-24: qty 2

## 2021-06-24 MED ORDER — BUPIVACAINE HCL 0.5 % IJ SOLN
5.0000 mL | Freq: Once | INTRAMUSCULAR | Status: AC
  Administered 2021-06-24: 5 mL
  Filled 2021-06-24: qty 1

## 2021-06-24 MED ORDER — SODIUM CHLORIDE 0.9 % IV BOLUS
500.0000 mL | Freq: Once | INTRAVENOUS | Status: AC
  Administered 2021-06-24: 500 mL via INTRAVENOUS

## 2021-06-24 NOTE — ED Provider Notes (Signed)
[facility]  CSN: [id number] Arrival date & time: 06/23/21 2043  Chief Complaint(s) Headache  HPI Chelsea Frost is a 40 y.o. female    Headache Pain location:  Occipital (left) Quality:  Sharp and stabbing Severity currently:  10/10 Onset quality:  Sudden Duration:  12 hours Timing:  Constant Chronicity:  New Relieved by:  Nothing Worsened by:  Neck movement Associated symptoms: neck pain and paresthesias   Associated symptoms: no blurred vision, no eye pain, no facial pain, no fever, no focal weakness, no visual change and no weakness   Associated symptoms comment:  Left shoulder soreness   Past Medical History Past Medical History:  Diagnosis Date   Anxiety    Eczema    GERD (gastroesophageal reflux disease)    Gestational diabetes    glyburide   Gestational diabetes mellitus (GDM), antepartum    Hx of varicella    Hypertension    labetolol 200 mg bid   Migraine    Patient Active Problem List   Diagnosis Date Noted   SVD 5/17 03/12/2018   HTN in pregnancy, chronic 03/12/2018   GDM, class A2 03/12/2018   Maternal anemia, with delivery 03/12/2018   Depression with anxiety 03/12/2018   Chronic migraine w/o aura w/o status migrainosus, not intractable 07/15/2017   Postpartum care following vaginal delivery 02/07/2016   Home Medication(s) Prior to Admission medications   Medication Sig Start Date End Date Taking? Authorizing Provider  busPIRone (BUSPAR) 5 MG tablet Take 5 mg by mouth 2 (two) times daily. 7.5 in morning and 5 mg at night    [provider]  butalbital-acetaminophen-caffeine (FIORICET) 50-325-40 MG tablet TAKE 1 TABLET BY MOUTH EVERY 6 HOURS AS NEEDED FOR HEADACHE 08/29/20   Glean SalvoSlack, Sarah J, NP  FLUoxetine (PROZAC) 10 MG tablet Take 10 mg by mouth. 07/30/20   [provider]  FLUoxetine (PROZAC) 20 MG capsule Take 20 mg by mouth daily. 08/10/20   [provider]   levocetirizine (XYZAL) 5 MG tablet Take 5 mg by mouth every evening.    [provider]  levonorgestrel (MIRENA) 20 MCG/24HR IUD 1 each by Intrauterine route once.    [provider]  metaxalone (SKELAXIN) 800 MG tablet Take 1 tablet (800 mg total) by mouth as needed for muscle spasms. #30/month 08/29/20   Glean SalvoSlack, Sarah J, NP  naratriptan (AMERGE) 2.5 MG tablet Take 1 tablet (2.5 mg total) by mouth as needed for migraine. Take one (1) tablet at onset of headache; if returns or does not resolve, may repeat after 4 hours; do not exceed five (5) mg in 24 hours. 08/29/20   Glean SalvoSlack, Sarah J, NP  ondansetron (ZOFRAN-ODT) 4 MG disintegrating tablet Take 1 tablet (4 mg total) by mouth every 8 (eight) hours as needed. #20/month 05/29/20   Levert FeinsteinYan, Yijun, MD  SAXENDA 18 MG/3ML SOPN Inject 3 mg into the skin daily. 08/20/18   [provider]  topiramate (TOPAMAX) 100 MG tablet Take 1 tablet (100 mg total) by mouth 2 (two) times daily. 08/29/20   Glean SalvoSlack, Sarah J, NP  verapamil (CALAN) 40 MG tablet Take 1 tablet (40 mg total) by mouth daily. 01/16/21   Yates DecampGanji, Jay, MD  Past Surgical History Past Surgical History:  Procedure Laterality Date   BREAST REDUCTION SURGERY     KNEE SURGERY Left    WISDOM TOOTH EXTRACTION     Family History Family History  Problem Relation Age of Onset   Lupus Father        lupus anticoagulant   Pulmonary embolism Father    Hiatal hernia Father    Arrhythmia Father    Deep vein thrombosis Father    Migraines Father    GER disease Father    Multiple sclerosis Paternal Aunt    Lupus Paternal Aunt    COPD Maternal Grandmother    Cancer Maternal Grandfather        colon   Hypertension Mother    ADD / ADHD Sister     Social History Social History   Tobacco Use   Smoking status: Never    Passive exposure: Never   Smokeless  tobacco: Never  Vaping Use   Vaping Use: Never used  Substance Use Topics   Alcohol use: Yes    Comment: Occasional use   Drug use: No   Allergies Imitrex [sumatriptan] and Amoxicillin  Review of Systems Review of Systems  Constitutional:  Negative for fever.  Eyes:  Negative for blurred vision and pain.  Musculoskeletal:  Positive for neck pain.  Neurological:  Positive for headaches and paresthesias. Negative for focal weakness and weakness.  All other systems are reviewed and are negative for acute change except as noted in the HPI  Physical Exam Vital Signs  I have reviewed the triage vital signs BP (!) 137/98   Pulse 75   Temp 98.2 F (36.8 C)   Resp 16   SpO2 99%   Breastfeeding No   Physical Exam Vitals reviewed.  Constitutional:      General: She is not in acute distress.    Appearance: She is well-developed. She is not diaphoretic.  HENT:     Head: Normocephalic and atraumatic.     Right Ear: External ear normal.     Left Ear: External ear normal.     Nose: Nose normal.  Eyes:     General: No scleral icterus.    Conjunctiva/sclera: Conjunctivae normal.  Neck:     Trachea: Phonation normal.   Cardiovascular:     Rate and Rhythm: Normal rate and regular rhythm.  Pulmonary:     Effort: Pulmonary effort is normal. No respiratory distress.     Breath sounds: No stridor.  Abdominal:     General: There is no distension.  Musculoskeletal:        General: Normal range of motion.     Cervical back: Normal range of motion. No rigidity. Muscular tenderness present. No spinous process tenderness.  Neurological:     Mental Status: She is alert and oriented to person, place, and time.  Psychiatric:        Behavior: Behavior normal.    ED Results and Treatments Labs (all labs ordered are listed, but only abnormal results are displayed) Labs Reviewed  COMPREHENSIVE METABOLIC PANEL - Abnormal; Notable for the following components:      Result Value    Potassium 3.3 (*)    Glucose, Bld 101 (*)    All other components within normal limits  URINALYSIS, ROUTINE W REFLEX MICROSCOPIC - Abnormal; Notable for the following components:   Color, Urine COLORLESS (*)    APPearance HAZY (*)    Specific Gravity, Urine <1.005 (*)    Ketones, ur 15 (*)  Leukocytes,Ua MODERATE (*)    Bacteria, UA RARE (*)    All other components within normal limits  RESP PANEL BY RT-PCR (FLU A&B, COVID) ARPGX2  CBC WITH DIFFERENTIAL/PLATELET  PREGNANCY, URINE                                                                                                                         EKG  EKG Interpretation  Date/Time:    Ventricular Rate:    PR Interval:    QRS Duration:   QT Interval:    QTC Calculation:   R Axis:     Text Interpretation:         Radiology CT HEAD WO CONTRAST ( )  Result Date: 06/23/2021 CLINICAL DATA:  Headache. EXAM: CT HEAD WITHOUT CONTRAST TECHNIQUE: Contiguous axial images were obtained from the base of the skull through the vertex without intravenous contrast. COMPARISON:  October 21, 2013 FINDINGS: Brain: No evidence of acute infarction, hemorrhage, hydrocephalus, extra-axial collection or mass lesion/mass effect. Vascular: No hyperdense vessel or unexpected calcification. Skull: Normal. Negative for fracture or focal lesion. Sinuses/Orbits: No acute finding. Other: None. IMPRESSION: No acute intracranial pathology. Electronically Signed   By: Aram Candela M.D.   On: 06/23/2021 22:51    Pertinent labs & imaging results that were available during my care of the patient were reviewed by me and considered in my medical decision making (see MDM for details).  Medications Ordered in ED Medications  bupivacaine (MARCAINE) 0.5 % (with pres) injection 5 mL (5 mLs Infiltration Given 06/24/21 0043)  ketorolac (TORADOL) 15 MG/ML injection 15 mg (15 mg Intravenous Given 06/24/21 0216)  prochlorperazine (COMPAZINE) injection 10 mg (10 mg  Intravenous Given 06/24/21 0216)  diphenhydrAMINE (BENADRYL) injection 12.5 mg (12.5 mg Intravenous Given 06/24/21 0216)  sodium chloride 0.9 % bolus 500 mL (0 mLs Intravenous Stopped 06/24/21 0321)                                                                                                                                     Procedures .Nerve Block  Date/Time: 06/24/2021 3:20 AM Performed by: Nira Conn, MD Authorized by: Nira Conn, MD   Consent:    Consent obtained:  Verbal   Consent given by:  Patient   Risks discussed:  Infection, nerve damage and pain   Alternatives discussed:  Alternative treatment Universal protocol:    Patient identity  confirmed:  Verbally with patient Indications:    Indications:  Pain relief Location:    Body area:  Head   Head nerve:  Lesser occipital   Laterality:  Left Pre-procedure details:    Skin preparation:  Alcohol Procedure details:    Block needle gauge:  25 G   Anesthetic injected:  Bupivacaine 0.5% w/o epi   Injection procedure:  Anatomic landmarks identified, incremental injection, negative aspiration for blood, anatomic landmarks palpated and introduced needle   Paresthesia:  None Post-procedure details:    Outcome:  Pain improved  (including critical care time)  Medical Decision Making / ED Course I have reviewed the nursing notes for this encounter and the patient's prior records (if available in EHR or on provided paperwork).  Chelsea Frost was evaluated in Emergency Department on 06/24/2021 for the symptoms described in the history of present illness. She was evaluated in the context of the global COVID-19 pandemic, which necessitated consideration that the patient might be at risk for infection with the SARS-CoV-2 virus that causes COVID-19. Institutional protocols and algorithms that pertain to the evaluation of patients at risk for COVID-19 are in a state of rapid change based on information released  by regulatory bodies including the CDC and federal and state organizations. These policies and algorithms were followed during the patient's care in the ED.     Non focal neuro exam. No recent head trauma. No fever. Doubt meningitis. Doubt intracranial bleed. Doubt IIH.   Favoring occipital neuralgia.  CT head obtained in triage and negative for ICH. Labs reassuring.  Mild improvement with nerve block.  Will treat with migraine cocktail and reevaluate.  Pertinent labs & imaging results that were available during my care of the patient were reviewed by me and considered in my medical decision making:  Headache resolved.  Final Clinical Impression(s) / ED Diagnoses Final diagnoses:  Bad headache   The patient appears reasonably screened and/or stabilized for discharge and I doubt any other medical condition or other [facility] requiring further screening, evaluation, or treatment in the ED at this time prior to discharge. Safe for discharge with strict return precautions.  Disposition: Discharge  Condition: Good  I have discussed the results, Dx and Tx plan with the patient/family who expressed understanding and agree(s) with the plan. Discharge instructions discussed at length. The patient/family was given strict return precautions who verbalized understanding of the instructions. No further questions at time of discharge.    ED Discharge Orders     None       Follow Up: Medicine, [facility] [address] Vella Raring [place] [address] [phone]  Call  as needed    This chart was dictated using voice recognition software.  Despite best efforts to proofread,  errors can occur which can change the documentation meaning.    Nira Conn, MD 06/24/21 [phone]

## 2021-06-24 NOTE — Telephone Encounter (Signed)
Reviewed, CT head wo at ED on June 23 2021 was normal.

## 2021-09-04 ENCOUNTER — Ambulatory Visit: Payer: BC Managed Care – PPO | Admitting: Neurology

## 2021-09-05 ENCOUNTER — Other Ambulatory Visit: Payer: Self-pay | Admitting: Neurology

## 2021-09-10 ENCOUNTER — Telehealth: Payer: Self-pay | Admitting: Neurology

## 2021-09-10 ENCOUNTER — Encounter: Payer: Self-pay | Admitting: *Deleted

## 2021-09-10 ENCOUNTER — Other Ambulatory Visit: Payer: Self-pay | Admitting: *Deleted

## 2021-09-10 MED ORDER — TOPIRAMATE 100 MG PO TABS: 100.0000 mg | ORAL_TABLET | Freq: Two times a day (BID) | ORAL | 0 refills | Status: DC

## 2021-09-10 NOTE — Telephone Encounter (Signed)
Pending appt 11/06/20. _____________________________________ Chelsea Frost note from 08/29/20:  May try to back off Topamax, 50 mg am/100 mg PM, side effect of memory loss, if does well, may just take 100 mg at bedtime, resume if headaches return. _____________________________________ Rx still written for topiramate 100mg , one tab BID. We need to confirm the dosing schedule with the patient prior to sending refills. _____________________________________ Left message for a return call.

## 2021-09-10 NOTE — Telephone Encounter (Signed)
Pt has r/s her f/u and would now like a refill on her  topiramate (TOPAMAX) 100 MG tablet [id number] until her upcoming appointment

## 2021-09-10 NOTE — Telephone Encounter (Signed)
We also received a refill request from the pharmacy and sent back a message for the patient to call our office.

## 2021-09-10 NOTE — Telephone Encounter (Signed)
I attempted to reach patient again. Mychart message sent to patient. Once we here back with the current dosage, we will send the refill to pharmacy.

## 2021-09-11 ENCOUNTER — Ambulatory Visit: Payer: BC Managed Care – PPO | Admitting: Neurology

## 2021-10-10 ENCOUNTER — Encounter: Payer: Self-pay | Admitting: Neurology

## 2021-10-13 ENCOUNTER — Other Ambulatory Visit: Payer: Self-pay | Admitting: *Deleted

## 2021-10-13 MED ORDER — NARATRIPTAN HCL 2.5 MG PO TABS: 2.5000 mg | ORAL_TABLET | ORAL | 0 refills | Status: DC | PRN

## 2021-11-06 ENCOUNTER — Ambulatory Visit: Payer: BC Managed Care – PPO | Admitting: Neurology

## 2021-11-06 ENCOUNTER — Telehealth: Payer: Self-pay | Admitting: Neurology

## 2021-11-06 NOTE — Telephone Encounter (Signed)
Pt cancelling appt due to mother has been taken to hospital

## 2021-11-06 NOTE — Telephone Encounter (Signed)
Noted, thank you

## 2021-11-06 NOTE — Progress Notes (Deleted)
PATIENT: Chelsea Frost DOB: Aug 09, 1981  REASON FOR VISIT: follow up for migraines HISTORY FROM: patient Primary Neurologist: Dr. Cloretta Ned, is a 41 year old female, seen in refer by  her primary care doctor Devra Dopp  for evaluation of migraine headaches, initial evaluation was on July 15 2017.   Reviewed and summarized the referring note, she had a history of  obesity, impaired glucose intolerance, Vitamin D deficiency, anxiety disorder, acid reflux, chronic migraine headaches,   She had migraine headaches since twenties, her typical migraine starting from neck, spreading forward, become severe pounding headache, with high-pitched noise, smell sensitivity, nauseous, mild light sensitivity, movement make her headache worse,   She was under the care of  headache and neck pain clinic by Dr.Lewit, most recent visit was on Mar 24 2017.   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  She has been taking Fioricet about twice a week,   She has abnormal glucose during the pregnancy,  is taking saxenda now.   Previously Topamax worked well as migraine prevention, Amerge works as migraine abortive treatment   UPDATE June 9th 2020: She continues to have occasionally headaches, but much improved, couple times each month, tolerating Topamax 200 mg daily, taking Amerge as needed, which usually take away her headache in 1 hour, sometimes need combination of Skelaxin, and NSAIDs,  Update August 29, 2020 SS: Here today for follow-up, migraines currently well controlled, 1 migraine every 3 to 4 months.  Remains on Topamax 100 mg twice a day, has noted some memory loss, wonders about dropping the dose.  Having some palpitations, cardiology has started verapamil 80 mg daily.  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  Take Skelaxin for tension. Over years has done PT, works for a while, then wears off, right shoulder lower than left with tension.  For migraine headache, will take Amerge, for moderate headache, takes Fioricet.  Update November 06, 2021 SS:   REVIEW OF SYSTEMS: Out of a complete 14 system review of symptoms, the patient complains only of the following symptoms, and all other reviewed systems are negative.  Headache  ALLERGIES: Allergies  Allergen Reactions   Imitrex [Sumatriptan] Other (See Comments)    Jaw issues    Amoxicillin Rash    Has patient had a PCN reaction causing immediate rash, facial/tongue/throat swelling, SOB or lightheadedness with hypotension: No Has patient had a PCN reaction causing severe rash involving mucus membranes or skin necrosis: No Has patient had a PCN reaction that required hospitalization: No Has patient  had a PCN reaction occurring within the last 10 years: Yes If all of the above answers are "NO", then may proceed with Cephalosporin use.     HOME MEDICATIONS: Outpatient Medications Prior to Visit  Medication Sig  Dispense Refill   busPIRone (BUSPAR) 5 MG tablet Take 5 mg by mouth 2 (two) times daily. 7.5 in morning and 5 mg at night     butalbital-acetaminophen-caffeine (FIORICET) 50-325-40 MG tablet TAKE 1 TABLET BY MOUTH EVERY 6 HOURS AS NEEDED FOR HEADACHE 15 tablet 1   FLUoxetine (PROZAC) 10 MG tablet Take 10 mg by mouth.     FLUoxetine (PROZAC) 20 MG capsule Take 20 mg by mouth daily.     levocetirizine (XYZAL) 5 MG tablet Take 5 mg by mouth every evening.     levonorgestrel (MIRENA) 20 MCG/24HR IUD 1 each by Intrauterine route once.     metaxalone (SKELAXIN) 800 MG tablet Take 1 tablet (800 mg total) by mouth as needed for muscle spasms. #30/month 30 tablet 3   naratriptan (AMERGE) 2.5 MG tablet Take 1 tablet (2.5 mg total) by mouth as needed for migraine. Take one (1) tablet at onset of headache; if returns or does not resolve, may repeat after 4 hours; do not exceed five (5) mg in 24 hours. 12 tablet 0   ondansetron (ZOFRAN-ODT) 4 MG disintegrating tablet Take 1 tablet (4 mg total) by mouth every 8 (eight) hours as needed. #20/month 20 tablet 2   SAXENDA 18 MG/3ML SOPN Inject 3 mg into the skin daily.  11   topiramate (TOPAMAX) 100 MG tablet Take 1 tablet (100 mg total) by mouth 2 (two) times daily. 180 tablet 0   verapamil (CALAN) 40 MG tablet Take 1 tablet (40 mg total) by mouth daily. 90 tablet 3   No facility-administered medications prior to visit.    PAST MEDICAL HISTORY: Past Medical History:  Diagnosis Date   Anxiety    Eczema    GERD (gastroesophageal reflux disease)    Gestational diabetes    glyburide   Gestational diabetes mellitus (GDM), antepartum    Hx of varicella    Hypertension    labetolol 200 mg bid   Migraine     PAST SURGICAL HISTORY: Past Surgical History:  Procedure Laterality Date   BREAST REDUCTION SURGERY     KNEE SURGERY Left    WISDOM TOOTH EXTRACTION      FAMILY HISTORY: Family History  Problem Relation Age of Onset   Lupus Father        lupus  anticoagulant   Pulmonary embolism Father    Hiatal hernia Father    Arrhythmia Father    Deep vein thrombosis Father    Migraines Father    GER disease Father    Multiple sclerosis Paternal Aunt    Lupus Paternal Aunt    COPD Maternal Grandmother    Cancer Maternal Grandfather        colon   Hypertension Mother    ADD / ADHD Sister     SOCIAL HISTORY: Social History   Socioeconomic History   Marital status: Married    Spouse name: Not on file   Number of children: 2   Years of education: Masters   Highest education level: Not on file  Occupational History   Occupation: Radiation protection practitionerpeech/Language Pathologist  Tobacco Use   Smoking status: Never    Passive exposure: Never   Smokeless tobacco: Never  Vaping Use   Vaping Use: Never used  Substance and Sexual  Activity   Alcohol use: Yes    Comment: Occasional use   Drug use: No   Sexual activity: Yes    Comment: Mirena  Other Topics Concern   Not on file  Social History Narrative   Lives at home with her husband and child.   Right-handed.   Two 12oz bottles of Diet Dr. Reino Kent per day.   Social Determinants of Health   Financial Resource Strain: Not on file  Food Insecurity: Not on file  Transportation Needs: Not on file  Physical Activity: Not on file  Stress: Not on file  Social Connections: Not on file  Intimate Partner Violence: Not on file   PHYSICAL EXAM  There were no vitals filed for this visit.  There is no height or weight on file to calculate BMI.  Generalized: Well developed, in no acute distress   Neurological examination  Mentation: Alert oriented to time, place, history taking. Follows all commands speech and language fluent Cranial nerve II-XII: Pupils were equal round reactive to light. Extraocular movements were full, visual field were full on confrontational test. Facial sensation and strength were normal. Is sitting posture, right shoulder lower then left.  Motor: The motor testing reveals 5 over  5 strength of all 4 extremities. Good symmetric motor tone is noted throughout.  Sensory: Sensory testing is intact to soft touch on all 4 extremities. No evidence of extinction is noted.  Coordination: Cerebellar testing reveals good finger-nose-finger and heel-to-shin bilaterally.  Gait and station: Gait is normal.  Reflexes: Deep tendon reflexes are symmetric and normal bilaterally.   DIAGNOSTIC DATA (LABS, IMAGING, TESTING) - I reviewed patient records, labs, notes, testing and imaging myself where available.  Lab Results  Component Value Date   WBC 6.0 06/23/2021   HGB 13.2 06/23/2021   HCT 38.3 06/23/2021   MCV 91.0 06/23/2021   PLT 299 06/23/2021      Component Value Date/Time   NA 139 06/23/2021 2235   K 3.3 (L) 06/23/2021 2235   CL 106 06/23/2021 2235   CO2 23 06/23/2021 2235   GLUCOSE 101 (H) 06/23/2021 2235   BUN 9 06/23/2021 2235   CREATININE 0.84 06/23/2021 2235   CALCIUM 9.3 06/23/2021 2235   PROT 7.7 06/23/2021 2235   ALBUMIN 4.3 06/23/2021 2235   AST 18 06/23/2021 2235   ALT 13 06/23/2021 2235   ALKPHOS 43 06/23/2021 2235   BILITOT 0.5 06/23/2021 2235   GFRNONAA >60 06/23/2021 2235   GFRAA >60 03/10/2018 2333   No results found for: CHOL, HDL, LDLCALC, LDLDIRECT, TRIG, CHOLHDL No results found for: JTTS1X No results found for: VITAMINB12 No results found for: TSH  ASSESSMENT AND PLAN 41 y.o. year old female  has a past medical history of Anxiety, Eczema, GERD (gastroesophageal reflux disease), Gestational diabetes, Gestational diabetes mellitus (GDM), antepartum, varicella, Hypertension, and Migraine. here with:  1.  Chronic migraine headaches -Currently under excellent control, 1 migraine every 3 to 4 months -May try to back off Topamax, 50 mg am/100 mg PM, side effect of memory loss, if does well, may just take 100 mg at bedtime, resume if headaches return -Continue Amerge, Fioricet for acute migraine, may combine with Skelaxin, naproxen, Zofran for  prolonged severe headaches -For cervical tension/shoulders, recommend heat, massage, stretching, exercise as tolerated -Follow-up in 1 year or sooner if needed    Otila Kluver, DNP 11/06/2021, 5:41 AM [facility] [address], [address] [place], [place] [address] [phone]

## 2021-12-10 ENCOUNTER — Other Ambulatory Visit: Payer: Self-pay | Admitting: Neurology

## 2021-12-10 NOTE — Telephone Encounter (Signed)
Rx refilled. Patient needs appointment for further refills. 

## 2022-01-01 NOTE — Progress Notes (Unsigned)
Primary Physician:  [facility]   Patient ID: NYARI OLSSON, female    DOB: 16-May-1981, 41 y.o.   MRN: [id number]  Subjective:    No chief complaint on file.   HPI: Chelsea Frost  is a 41 y.o. female  with history of gestational diabetes, mild to moderate obesity, chronic migraine headaches, essential hypertension, and inappropriate sinus tachycardia.  Patient symptoms previously well controlled on metoprolol, however she had recurrence of palpitations and therefore was switched from metoprolol to verapamil 40 mg daily with up to 2 additional tablet as needed for palpitations.   Patient presents for annual follow-up of PVCs and inappropriate sinus tachycardia.  Last office visit patient was stable from a cardiovascular standpoint and no changes were made.***   ***  Patient presents for annual follow-up of PVCs and inappropriate sinus tachycardia.  Patient continues to have occasional episodes of palpitations, which seem to be worse when allergies are acting up.  Patient resting heart symptoms are relatively controlled with verapamil 40 mg daily, she takes additional dose of verapamil as needed palpitations.  Overall symptoms are stable and well controlled.  She does report mild shortness of breath associated with increased frequency of palpitations, however this improves when palpitations improve.  Patient has 2 young children and works full-time, therefore she struggles to maintain a regular exercise routine, but is relatively active.  She remains on Saxenda for weight loss, however notably her weight has increased since last visit in our office.  Patient admits to "stress eating" lately.  Notably patient has also had migraines the last 2 nights and is having back pain at this time, which may be contributing to patient's elevated blood pressure in our office today.  She denies chest pain, syncope, near syncope, dizziness.   Past Medical History:   Diagnosis Date   Anxiety    Eczema    GERD (gastroesophageal reflux disease)    Gestational diabetes    glyburide   Gestational diabetes mellitus (GDM), antepartum    Hx of varicella    Hypertension    labetolol 200 mg bid   Migraine     Past Surgical History:  Procedure Laterality Date   BREAST REDUCTION SURGERY     KNEE SURGERY Left    WISDOM TOOTH EXTRACTION     Social History   Tobacco Use   Smoking status: Never    Passive exposure: Never   Smokeless tobacco: Never  Substance Use Topics   Alcohol use: Yes    Comment: Occasional use   Marital status: Married   ROS:   Review of Systems  Constitutional: Negative for decreased appetite, malaise/fatigue, weight gain and weight loss.  Eyes:  Negative for visual disturbance.  Cardiovascular:  Negative for chest pain, claudication, leg swelling, near-syncope, orthopnea, palpitations, paroxysmal nocturnal dyspnea and syncope.  Respiratory:  Positive for shortness of breath (occasional brief ). Negative for hemoptysis and wheezing.   Endocrine: Negative for cold intolerance and heat intolerance.  Hematologic/Lymphatic: Does not bruise/bleed easily.  Skin:  Negative for nail changes.  Musculoskeletal:  Negative for muscle weakness and myalgias.  Gastrointestinal:  Negative for abdominal pain, change in bowel habit, melena, nausea and vomiting.  Neurological:  Negative for difficulty with concentration, dizziness, focal weakness, headaches and weakness.  Psychiatric/Behavioral:  Negative for altered mental status and suicidal ideas.   All other systems reviewed and are negative.    Objective:  There were no vitals taken for this visit. There is no height  or weight on file to calculate BMI.    Physical Exam Vitals reviewed.  Constitutional:      Appearance: She is well-developed.  HENT:     Head: Normocephalic and atraumatic.  Cardiovascular:     Rate and Rhythm: Normal rate and regular rhythm.     Pulses: Intact  distal pulses.          Carotid pulses are 2+ on the right side and 2+ on the left side.      Radial pulses are 2+ on the right side and 2+ on the left side.       Popliteal pulses are 2+ on the right side and 2+ on the left side.       Dorsalis pedis pulses are 2+ on the right side and 2+ on the left side.       Posterior tibial pulses are 2+ on the right side and 2+ on the left side.     Heart sounds: Normal heart sounds, S1 normal and S2 normal. No murmur heard.   No gallop.  Pulmonary:     Effort: Pulmonary effort is normal. No accessory muscle usage or respiratory distress.     Breath sounds: Normal breath sounds. No wheezing, rhonchi or rales.  Abdominal:     General: Bowel sounds are normal.     Palpations: Abdomen is soft.  Musculoskeletal:        General: Normal range of motion.     Cervical back: Normal range of motion.     Right lower leg: No edema.     Left lower leg: No edema.  Skin:    General: Skin is warm and dry.  Neurological:     Mental Status: She is alert and oriented to person, place, and time.   Laboratory examination:    CMP Latest Ref Rng & Units 06/23/2021 03/10/2018 02/28/2018  Glucose 70 - 99 mg/dL 101(H) 99 94  BUN 6 - 20 mg/dL 9 9 6   Creatinine 0.44 - 1.00 mg/dL 0.84 0.58 0.44  Sodium 135 - 145 mmol/L 139 136 135  Potassium 3.5 - 5.1 mmol/L 3.3(L) 4.3 3.9  Chloride 98 - 111 mmol/L 106 106 107  CO2 22 - 32 mmol/L 23 21(L) 19(L)  Calcium 8.9 - 10.3 mg/dL 9.3 9.3 8.8(L)  Total Protein 6.5 - 8.1 g/dL 7.7 6.6 6.8  Total Bilirubin 0.3 - 1.2 mg/dL 0.5 0.3 0.2(L)  Alkaline Phos 38 - 126 U/L 43 151(H) 119  AST 15 - 41 U/L 18 17 14(L)  ALT 0 - 44 U/L 13 9(L) 9(L)   CBC Latest Ref Rng & Units 06/23/2021 03/12/2018 03/10/2018  WBC 4.0 - 10.5 K/uL 6.0 7.4 9.2  Hemoglobin 12.0 - 15.0 g/dL 13.2 9.6(L) 10.2(L)  Hematocrit 36.0 - 46.0 % 38.3 28.9(L) 30.4(L)  Platelets 150 - 400 K/uL 299 209 247   Lipid Panel  No results found for: CHOL, TRIG, HDL, CHOLHDL, VLDL,  LDLCALC, LDLDIRECT HEMOGLOBIN A1C No results found for: HGBA1C, MPG TSH No results for input(s): TSH in the last 8760 hours.  External labs:  02/01/2020: TSH 1.08 Hemoglobin 13.3, hematocrit 39.4, MCV 93, platelets 247 A1c 4.6% Total cholesterol 150, triglycerides 98, HDL 48, LDL 84 Glucose 80, BUN 10, creatinine 0.81, GFR 94, sodium 138, potassium 4.2, AST 12, ALT 9, alk phos 48  Allergies   Allergies  Allergen Reactions   Imitrex [Sumatriptan] Other (See Comments)    Jaw issues    Amoxicillin Rash    Has patient had  a PCN reaction causing immediate rash, facial/tongue/throat swelling, SOB or lightheadedness with hypotension: No Has patient had a PCN reaction causing severe rash involving mucus membranes or skin necrosis: No Has patient had a PCN reaction that required hospitalization: No Has patient had a PCN reaction occurring within the last 10 years: Yes If all of the above answers are "NO", then may proceed with Cephalosporin use.     Medications Prior to Visit:   Outpatient Medications Prior to Visit  Medication Sig Dispense Refill   busPIRone (BUSPAR) 5 MG tablet Take 5 mg by mouth 2 (two) times daily. 7.5 in morning and 5 mg at night     butalbital-acetaminophen-caffeine (FIORICET) 50-325-40 MG tablet TAKE 1 TABLET BY MOUTH EVERY 6 HOURS AS NEEDED FOR HEADACHE 15 tablet 1   FLUoxetine (PROZAC) 10 MG tablet Take 10 mg by mouth.     FLUoxetine (PROZAC) 20 MG capsule Take 20 mg by mouth daily.     levocetirizine (XYZAL) 5 MG tablet Take 5 mg by mouth every evening.     levonorgestrel (MIRENA) 20 MCG/24HR IUD 1 each by Intrauterine route once.     metaxalone (SKELAXIN) 800 MG tablet Take 1 tablet (800 mg total) by mouth as needed for muscle spasms. #30/month 30 tablet 3   naratriptan (AMERGE) 2.5 MG tablet Take 1 tablet (2.5 mg total) by mouth as needed for migraine. Take one (1) tablet at onset of headache; if returns or does not resolve, may repeat after 4 hours; do not  exceed five (5) mg in 24 hours. 12 tablet 0   ondansetron (ZOFRAN-ODT) 4 MG disintegrating tablet Take 1 tablet (4 mg total) by mouth every 8 (eight) hours as needed. #20/month 20 tablet 2   SAXENDA 18 MG/3ML SOPN Inject 3 mg into the skin daily.  11   topiramate (TOPAMAX) 100 MG tablet TAKE 1 TABLET BY MOUTH TWICE A DAY 180 tablet 0   verapamil (CALAN) 40 MG tablet Take 1 tablet (40 mg total) by mouth daily. 90 tablet 3   No facility-administered medications prior to visit.   Final Medications at End of Visit    No outpatient medications have been marked as taking for the 01/02/22 encounter (Appointment) with Rayetta Pigg, Kambree Krauss C, PA-C.   Radiology:  No results found.  Cardiac Studies:   Treadmill Exercise Stress   07/19/2019: Resting EKG normal sinus rhythm.  Stress EKG negative for myocardial ischemia. Chest pain is not present. The patient exercised for 15 minutes and 59 seconds on Bruce protocol;  Stage 3 held for 10 min per patient request. achieved 8.75 METs at 86% of maximum predicted heart rate.  Normal exercise tolerance.  Normal blood pressure response.  No inducible arrhythmias.   Impression: Negative treadmill exercise stress test using Bruce protocol, stage III held for 10 minutes per patient request, total exercise duration 15:59 min, Mets achieved 8.75.  Echo 06/29/2019:  LVEF 55-60%. Normal LV wall thickness and size. No wall motion abnormalities. Aortic valve is trileaflet,, no stenosis or regurgitation. Trace MR. Trace TR. Trace PR. RVSP normal.   Holter monitor 06/29/2019: Patient remained in sinus rhythm slowest heart rate 52 at 4:15 AM on day 2, maximum heart rate 145 bpm at 8:49 PM on day 2.   Average heart rate 83 bpm.   Sinus arrhythmia was noted with heart rate 52 bpm at 4:15 AM on September 4.      139 episodes of sustained sinus tachycardia noted with total duration 299.5 minutes representing 10.6% of  total time.   The fastest episode of tachycardia with  heart rate 126 bpm.  The longest episode lasted 12 minutes.      5 episodes of sustained sinus bradycardia noted with total duration 4.1 minutes representing less than 1% total time.   The slowest episode of bradycardia with heart rate 58 bpm.  The longest episode lasted 1 minute.      190 isolated PVCs versus aberrantly conducted PACs noted.      Longest R-R interval 1.38 seconds at 4:15 AM on September 4.     1.51 seconds R to R interval was noted at 1:45 AM on day 2, but it was compensatory pause after PVC versus aberrantly conducted PAC.      No SVT, no A. fib, no VT, no long pauses 3 seconds or more noted.  Echocardiogram 01/27/2018: Left ventricle cavity is normal in size. Mild concentric hypertrophy of the left ventricle. Normal global wall motion. Calculated EF 55%. Left atrial cavity is mildly dilated at 4.4 cm. Structurally normal mitral valve with trace regurgitation. Structurally normal tricuspid valve with trace regurgitation.  Holter Monitor 48 hours 01/05/2018: Predominant rhythm is normal sinus rhythm. Rare PACs and PVCs. Symptoms of palpitations reveal normal sinus rhythm.   EKG:  ***   01/02/2021: Sinus rhythm at a rate of 79 bpm.  Normal axis.  No evidence of ischemia or underlying injury pattern.  Compared to EKG 01/03/2020, no significant change.  Assessment:   No diagnosis found. No orders of the defined types were placed in this encounter.  There are no discontinued medications.  Recommendations:   Chelsea Frost  is a 41 y.o. female  with history of gestational diabetes, mild to moderate obesity, chronic migraine headaches, essential hypertension, and inappropriate sinus tachycardia.  Patient symptoms previously well controlled on metoprolol, however she had recurrence of palpitations and therefore was switched from metoprolol to verapamil 40 mg daily with up to 2 additional tablet as needed for palpitations.   Patient presents for annual follow-up of  PVCs and inappropriate sinus tachycardia.  Last office visit patient was stable from a cardiovascular standpoint and no changes were made.***   ***  Patient presents for annual follow-up of PVC and inappropriate sinus tachycardia.  Patient does continue to have occasional episodes of palpitations, but overall symptom management has significantly improved with verapamil 40 mg daily and additional doses as needed.  She states when taking verapamil 40 mg twice daily she was feeling mildly fatigued.  Overall patient is happy with her symptom management at this time.  She has had no recurrence of chest pain and is otherwise asymptomatic.   Counseled patient regarding weight loss, as well as diet and lifestyle modifications.  Also discussed that her blood pressure in the office today is borderline elevated, may be due to back pain at this time.  I have encouraged her to continue to monitor this and to notify our office if it remains elevated.  We will not make changes to her medications at this time.  I have personally reviewed external labs, lipids are well controlled.  Follow-up in 1 year, sooner if needed, for palpitations.   Alethia Berthold, PA-C 01/01/2022, 9:02 AM Office: [phone]

## 2022-01-02 ENCOUNTER — Ambulatory Visit: Payer: BC Managed Care – PPO | Admitting: Student

## 2022-01-11 ENCOUNTER — Other Ambulatory Visit: Payer: Self-pay | Admitting: Cardiology

## 2022-01-11 DIAGNOSIS — R002 Palpitations: Secondary | ICD-10-CM

## 2022-01-11 DIAGNOSIS — R030 Elevated blood-pressure reading, without diagnosis of hypertension: Secondary | ICD-10-CM

## 2022-02-16 ENCOUNTER — Ambulatory Visit: Payer: BC Managed Care – PPO | Admitting: Neurology

## 2022-02-16 ENCOUNTER — Encounter: Payer: Self-pay | Admitting: Neurology

## 2022-02-16 VITALS — BP 129/94 | HR 81 | Ht 62.75 in | Wt 178.0 lb

## 2022-02-16 DIAGNOSIS — G43709 Chronic migraine without aura, not intractable, without status migrainosus: Secondary | ICD-10-CM

## 2022-02-16 MED ORDER — TOPIRAMATE 100 MG PO TABS: 100.0000 mg | ORAL_TABLET | Freq: Two times a day (BID) | ORAL | 3 refills | Status: DC

## 2022-02-16 MED ORDER — NURTEC 75 MG PO TBDP: ORAL_TABLET | ORAL | 6 refills | Status: DC

## 2022-02-16 MED ORDER — ONDANSETRON 4 MG PO TBDP: 4.0000 mg | ORAL_TABLET | Freq: Three times a day (TID) | ORAL | 6 refills | Status: DC | PRN

## 2022-02-16 MED ORDER — TIZANIDINE HCL 4 MG PO TABS: 4.0000 mg | ORAL_TABLET | Freq: Four times a day (QID) | ORAL | 6 refills | Status: DC | PRN

## 2022-02-16 MED ORDER — NARATRIPTAN HCL 2.5 MG PO TABS: 2.5000 mg | ORAL_TABLET | ORAL | 6 refills | Status: DC | PRN

## 2022-02-16 NOTE — Patient Instructions (Signed)
Meds ordered this encounter  ?Medications  ? topiramate (TOPAMAX) 100 MG tablet  ?  Sig: Take 1 tablet (100 mg total) by mouth 2 (two) times daily.  ?  Dispense:  180 tablet  ?  Refill:  3  ? Rimegepant Sulfate (NURTEC) 75 MG TBDP  ?  Sig: Take 1 tab at onset of migraine.  May repeat in 2 hrs, if needed.  Max dose: 2 tabs/day. This is a 30 day prescription.  ?  Dispense:  12 tablet  ?  Refill:  6  ? naratriptan (AMERGE) 2.5 MG tablet  ?  Sig: Take 1 tablet (2.5 mg total) by mouth as needed for migraine. Take one (1) tablet at onset of headache; if returns or does not resolve, may repeat after 4 hours; do not exceed five (5) mg in 24 hours.  ?  Dispense:  12 tablet  ?  Refill:  6  ? tiZANidine (ZANAFLEX) 4 MG tablet  ?  Sig: Take 1 tablet (4 mg total) by mouth every 6 (six) hours as needed for muscle spasms.  ?  Dispense:  30 tablet  ?  Refill:  6  ? ondansetron (ZOFRAN-ODT) 4 MG disintegrating tablet  ?  Sig: Take 1 tablet (4 mg total) by mouth every 8 (eight) hours as needed for nausea or vomiting.  ?  Dispense:  20 tablet  ?  Refill:  6  ?   ? ?For severe prolonged migraine headaches, you may combine Amerge/or Nurtec with Aleve, Zofran as needed for nausea, tizanidine as needed for muscle relaxant, heating pad, sleep ?

## 2022-02-16 NOTE — Progress Notes (Signed)
? ? ?  ?ASSESSMENT AND PLAN ?41 y.o. year old female  ?Chronic migraine headaches ? Was under good control taking Topamax 100 mg every night, verapamil 40 mg daily, ? Flareup since February 2023 after excessive family stress, will increase Topamax to 100 mg 2 every night ? Continue Amerge 2.5 mg as needed, may combine it with Aleve, Zofran, tizanidine for prolonged severe headaches, ? Sometimes suboptimal control with Amerge, will try Nurtec 75 mg as needed ? Return to clinic in 6 months with nurse practitioner ? ?DIAGNOSTIC DATA (LABS, IMAGING, TESTING) ?- I reviewed patient records, labs, notes, testing and imaging myself where available. ?CT head without contrast August 2020 was normal ? ?HISTORY OF PRESENT ILLNESS: ?Chelsea Frost, is a 41 year old female, follow-up for migraine headaches ? ?PMHX. ?Depression, polypharmacy treatment, Prozac 60 mg daily, BuSpar 5 mg twice a day, trazodone as needed ? ?She had long history of migraine headaches, has been a patient of our clinic for a long time, previous MRI of the brain, MRA of the brain was normal ? ?Recent CT head without contrast in August 2022 was normal ? ?Her typical migraine lateralized retro-orbital's severe headache with associated light noise sensitivity, smell sensitivity, nauseous, lasting for few hours ? ?Topamax works well as preventive medications, she was taking 100 mg every night, along with verapamil 40 mg daily, her headache was under good control for many years, unfortunately mother passed away in February 2023, with increased stress, she complains of 3 major migraine headaches over the past 6 weeks, after taking Amerge, her headache will still last for few hours ? ?In addition, she takes Fioricet as needed for some not so severe headaches, ? ?PHYSICAL EXAM ? ?Vitals:  ? 02/16/22 0957  ?BP: (!) 129/94  ?Pulse: 81  ?Weight: 178 lb (80.7 kg)  ?Height: 5' 2.75" (1.594 m)  ? ?Body mass index is 31.78 kg/m?. ? ? ?PHYSICAL EXAMNIATION: ? ?Gen:  NAD, conversant, well nourised, well groomed                     ?Cardiovascular: Regular rate rhythm, no peripheral edema, warm, nontender. ?Eyes: Conjunctivae clear without exudates or hemorrhage ?Neck: Supple, no carotid bruits. ?Pulmonary: Clear to auscultation bilaterally  ? ?NEUROLOGICAL EXAM: ? ?MENTAL STATUS: ?Speech/cognition: ?Awake, alert, oriented to history taking and casual conversation ?  ?CRANIAL NERVES: ?CN II: Visual fields are full to confrontation.  Pupils are round equal and briskly reactive to light. ?CN III, IV, VI: extraocular movement are normal. No ptosis. ?CN V: Facial sensation is intact to pinprick in all 3 divisions bilaterally. Corneal responses are intact.  ?CN VII: Face is symmetric with normal eye closure and smile. ?CN VIII: Hearing is normal to casual conversation ?CN IX, X: Palate elevates symmetrically. Phonation is normal. ?CN XI: Head turning and shoulder shrug are intact  ? ?MOTOR: ?There is no pronator drift of out-stretched arms. Muscle bulk and tone are normal. Muscle strength is normal. ? ?REFLEXES: ?Reflexes are 2+ and symmetric at the biceps, triceps, knees, and ankles. Plantar responses are flexor. ? ?SENSORY: ?Intact to light touch, pinprick, positional and vibratory sensation are intact in fingers and toes. ? ?COORDINATION: ?Rapid alternating movements and fine finger movements are intact. There is no dysmetria on finger-to-nose and heel-knee-shin.   ? ?GAIT/STANCE: ?Posture is normal. Gait is steady with normal steps, base, arm swing, and turning. Heel and toe walking are normal. Tandem gait is normal.  ?Romberg is absent. ?  ? ?REVIEW OF SYSTEMS:  ?  Out of a complete 14 system review of symptoms, the patient complains only of the following symptoms, and all other reviewed systems are negative. ? ?Headache ? ?ALLERGIES: ?Allergies  ?Allergen Reactions  ? Imitrex [Sumatriptan] Other (See Comments)  ?  Jaw issues   ? Amoxicillin Rash  ?  Has patient had a PCN reaction  causing immediate rash, facial/tongue/throat swelling, SOB or lightheadedness with hypotension: No ?Has patient had a PCN reaction causing severe rash involving mucus membranes or skin necrosis: No ?Has patient had a PCN reaction that required hospitalization: No ?Has patient had a PCN reaction occurring within the last 10 years: Yes ?If all of the above answers are "NO", then may proceed with Cephalosporin use. ?  ? ? ?HOME MEDICATIONS: ?Outpatient Medications Prior to Visit  ?Medication Sig Dispense Refill  ? busPIRone (BUSPAR) 5 MG tablet Take 5 mg by mouth 2 (two) times daily. 7.5 in morning and 5 mg at night    ? butalbital-acetaminophen-caffeine (FIORICET) 50-325-40 MG tablet TAKE 1 TABLET BY MOUTH EVERY 6 HOURS AS NEEDED FOR HEADACHE 15 tablet 1  ? FLUoxetine (PROZAC) 20 MG capsule Take 20 mg by mouth daily.    ? FLUoxetine (PROZAC) 40 MG capsule Take 10 mg by mouth. 60 mg total    ? levocetirizine (XYZAL) 5 MG tablet Take 5 mg by mouth every evening.    ? levonorgestrel (MIRENA) 20 MCG/24HR IUD 1 each by Intrauterine route once.    ? metaxalone (SKELAXIN) 800 MG tablet Take 1 tablet (800 mg total) by mouth as needed for muscle spasms. #30/month 30 tablet 3  ? MOUNJARO 2.5 MG/0.5ML Pen Inject 2.5 mg into the skin.    ? naratriptan (AMERGE) 2.5 MG tablet Take 1 tablet (2.5 mg total) by mouth as needed for migraine. Take one (1) tablet at onset of headache; if returns or does not resolve, may repeat after 4 hours; do not exceed five (5) mg in 24 hours. 12 tablet 0  ? ondansetron (ZOFRAN-ODT) 4 MG disintegrating tablet Take 1 tablet (4 mg total) by mouth every 8 (eight) hours as needed. #20/month 20 tablet 2  ? topiramate (TOPAMAX) 100 MG tablet TAKE 1 TABLET BY MOUTH TWICE A DAY 180 tablet 0  ? verapamil (CALAN) 40 MG tablet TAKE 1 TABLET BY MOUTH EVERY DAY 90 tablet 3  ? SAXENDA 18 MG/3ML SOPN Inject 3 mg into the skin daily.  11  ? ?No facility-administered medications prior to visit.  ? ? ?PAST MEDICAL  HISTORY: ?Past Medical History:  ?Diagnosis Date  ? Anxiety   ? Eczema   ? GERD (gastroesophageal reflux disease)   ? Gestational diabetes   ? glyburide  ? Gestational diabetes mellitus (GDM), antepartum   ? Hx of varicella   ? Hypertension   ? labetolol 200 mg bid  ? Migraine   ? ? ?PAST SURGICAL HISTORY: ?Past Surgical History:  ?Procedure Laterality Date  ? BREAST REDUCTION SURGERY    ? KNEE SURGERY Left   ? WISDOM TOOTH EXTRACTION    ? ? ?FAMILY HISTORY: ?Family History  ?Problem Relation Age of Onset  ? Lupus Father   ?     lupus anticoagulant  ? Pulmonary embolism Father   ? Hiatal hernia Father   ? Arrhythmia Father   ? Deep vein thrombosis Father   ? Migraines Father   ? GER disease Father   ? Multiple sclerosis Paternal Aunt   ? Lupus Paternal Aunt   ? COPD Maternal Grandmother   ?  Cancer Maternal Grandfather   ?     colon  ? Hypertension Mother   ? ADD / ADHD Sister   ? ? ?SOCIAL HISTORY: ?Social History  ? ?Socioeconomic History  ? Marital status: Married  ?  Spouse name: Not on file  ? Number of children: 2  ? Years of education: Masters  ? Highest education level: Not on file  ?Occupational History  ? Occupation: Radiation protection practitioner  ?Tobacco Use  ? Smoking status: Never  ?  Passive exposure: Never  ? Smokeless tobacco: Never  ?Vaping Use  ? Vaping Use: Never used  ?Substance and Sexual Activity  ? Alcohol use: Yes  ?  Comment: Occasional use  ? Drug use: No  ? Sexual activity: Yes  ?  Comment: Mirena  ?Other Topics Concern  ? Not on file  ?Social History Narrative  ? Lives at home with her husband and child.  ? Right-handed.  ? Two 12oz bottles of Diet Dr. Reino Kent per day.  ? ?Social Determinants of Health  ? ?Financial Resource Strain: Not on file  ?Food Insecurity: Not on file  ?Transportation Needs: Not on file  ?Physical Activity: Not on file  ?Stress: Not on file  ?Social Connections: Not on file  ?Intimate Partner Violence: Not on file  ? ?Levert Feinstein, M.D. Ph.D. ? ?[facility] ?[address] ?[place], [place] [address] ?Phone: [phone] ?Fax:      [phone]  ?

## 2022-06-14 ENCOUNTER — Encounter: Payer: Self-pay | Admitting: Neurology

## 2022-07-02 ENCOUNTER — Emergency Department (HOSPITAL_BASED_OUTPATIENT_CLINIC_OR_DEPARTMENT_OTHER): Payer: BC Managed Care – PPO | Admitting: Radiology

## 2022-07-02 ENCOUNTER — Other Ambulatory Visit: Payer: Self-pay

## 2022-07-02 ENCOUNTER — Encounter (HOSPITAL_BASED_OUTPATIENT_CLINIC_OR_DEPARTMENT_OTHER): Payer: Self-pay | Admitting: Emergency Medicine

## 2022-07-02 ENCOUNTER — Emergency Department (HOSPITAL_BASED_OUTPATIENT_CLINIC_OR_DEPARTMENT_OTHER): Payer: BC Managed Care – PPO

## 2022-07-02 ENCOUNTER — Emergency Department (HOSPITAL_BASED_OUTPATIENT_CLINIC_OR_DEPARTMENT_OTHER)
Admission: EM | Admit: 2022-07-02 | Discharge: 2022-07-02 | Disposition: A | Discharge status: Home / Self Care | Payer: BC Managed Care – PPO | Attending: Emergency Medicine | Admitting: Emergency Medicine

## 2022-07-02 DIAGNOSIS — Z79899 Other long term (current) drug therapy: Secondary | ICD-10-CM | POA: Diagnosis not present

## 2022-07-02 DIAGNOSIS — E871 Hypo-osmolality and hyponatremia: Secondary | ICD-10-CM | POA: Insufficient documentation

## 2022-07-02 DIAGNOSIS — R0682 Tachypnea, not elsewhere classified: Secondary | ICD-10-CM | POA: Diagnosis not present

## 2022-07-02 DIAGNOSIS — R531 Weakness: Secondary | ICD-10-CM | POA: Insufficient documentation

## 2022-07-02 DIAGNOSIS — Z20822 Contact with and (suspected) exposure to covid-19: Secondary | ICD-10-CM | POA: Diagnosis not present

## 2022-07-02 DIAGNOSIS — R202 Paresthesia of skin: Secondary | ICD-10-CM | POA: Diagnosis not present

## 2022-07-02 DIAGNOSIS — R200 Anesthesia of skin: Secondary | ICD-10-CM | POA: Diagnosis not present

## 2022-07-02 DIAGNOSIS — R4701 Aphasia: Secondary | ICD-10-CM | POA: Diagnosis not present

## 2022-07-02 DIAGNOSIS — I10 Essential (primary) hypertension: Secondary | ICD-10-CM | POA: Insufficient documentation

## 2022-07-02 DIAGNOSIS — R079 Chest pain, unspecified: Secondary | ICD-10-CM | POA: Insufficient documentation

## 2022-07-02 DIAGNOSIS — R509 Fever, unspecified: Secondary | ICD-10-CM | POA: Diagnosis not present

## 2022-07-02 LAB — BASIC METABOLIC PANEL
Anion gap: 10 (ref 5–15)
BUN: 6 mg/dL (ref 6–20)
CO2: 19 mmol/L — ABNORMAL LOW (ref 22–32)
Calcium: 8.5 mg/dL — ABNORMAL LOW (ref 8.9–10.3)
Chloride: 105 mmol/L (ref 98–111)
Creatinine, Ser: 0.89 mg/dL (ref 0.44–1.00)
GFR, Estimated: >60 mL/min (ref >60)
Glucose, Bld: 118 mg/dL — ABNORMAL HIGH (ref 70–99)
Potassium: 4.5 mmol/L (ref 3.5–5.1)
Sodium: 134 mmol/L — ABNORMAL LOW (ref 135–145)

## 2022-07-02 LAB — TROPONIN I (HIGH SENSITIVITY)
Troponin I (High Sensitivity): <2 ng/L (ref <18)
Troponin I (High Sensitivity): 2 ng/L (ref <18)

## 2022-07-02 LAB — CBC
HCT: 39.7 % (ref 36.0–46.0)
Hemoglobin: 13.7 g/dL (ref 12.0–15.0)
MCH: 30.6 pg (ref 26.0–34.0)
MCHC: 34.5 g/dL (ref 30.0–36.0)
MCV: 88.6 fL (ref 80.0–100.0)
Platelets: 197 10*3/uL (ref 150–400)
RBC: 4.48 MIL/uL (ref 3.87–5.11)
RDW: 13.4 % (ref 11.5–15.5)
WBC: 6.1 10*3/uL (ref 4.0–10.5)
nRBC: 0 % (ref 0.0–0.2)

## 2022-07-02 LAB — PREGNANCY, URINE: Preg Test, Ur: NEGATIVE

## 2022-07-02 LAB — RESP PANEL BY RT-PCR (FLU A&B, COVID) ARPGX2
Influenza A by PCR: NEGATIVE
Influenza B by PCR: NEGATIVE
SARS Coronavirus 2 by RT PCR: NEGATIVE

## 2022-07-02 MED ORDER — MORPHINE SULFATE (PF) 2 MG/ML IV SOLN
2.0000 mg | Freq: Once | INTRAVENOUS | Status: AC
  Administered 2022-07-02: 2 mg via INTRAVENOUS
  Filled 2022-07-02: qty 1

## 2022-07-02 MED ORDER — KETOROLAC TROMETHAMINE 15 MG/ML IJ SOLN
15.0000 mg | Freq: Once | INTRAMUSCULAR | Status: AC
  Administered 2022-07-02: 15 mg via INTRAVENOUS
  Filled 2022-07-02: qty 1

## 2022-07-02 NOTE — ED Notes (Signed)
Pt from home with cp x 3 days accompanied by generalized pain and sob. Pt reports that she has hx of muscle spasms and didn't think too much of it until it became more localized to her chest. Pt alert & oriented, nad noted.

## 2022-07-02 NOTE — ED Notes (Signed)
Patient ambulating to restroom, steady gait. Reports chest pain is " much better"

## 2022-07-02 NOTE — Discharge Instructions (Signed)
Please use Tylenol or ibuprofen for pain.  You may use 600 mg ibuprofen every 6 hours or 1000 mg of Tylenol every 6 hours.  You may choose to alternate between the 2.  This would be most effective.  Not to exceed 4 g of Tylenol within 24 hours.  Not to exceed 3200 mg ibuprofen 24 hours.  Your work-up today did not show any identifiable cause for your symptoms, I recommend that you follow-up with your cardiologist, urologist discussed the changes that have occurred over the last few days and have been occurring for the last several months.  In the meantime if you develop worsening shortness of breath, chest pain please return to the emergency department for further evaluation.

## 2022-07-02 NOTE — ED Provider Notes (Signed)
[facility] EMERGENCY DEPT Provider Note   CSN: [id number] Arrival date & time: 07/02/22  1610     History  Chief Complaint  Patient presents with   Chest Pain    Chelsea Frost is a 41 y.o. female with past medical history significant for migraines, anxiety, acid reflux, hypertension who presents with concern for chest pain, shortness of breath, fatigue, muscle spasm, and worsening left-sided tingling, numbness in the arm for the last 2 to 3 days.  Patient reports that she is been dealing with some nonspecific neurologic symptoms for the last month plus.  Patient has scheduled appointment with neurology to evaluate intermittent tingling, numbness that she has had in the left side with associated weakness.  Reports family history of multiple sclerosis.  Patient had a MRI around 1 month ago which did not show any salient cause of her findings.  She reports that she additionally has had a fever over the last 3 days, denies any sick contacts.  Reports negative COVID test on Tuesday evening.  She denies significant neck pain, vision changes at this time.  She reports that she has been having some aphasia, word finding difficulties although she reports that these symptoms have been present for around a month, not new over the last 3 days.   Chest Pain Associated symptoms: fever and numbness        Home Medications Prior to Admission medications   Medication Sig Start Date End Date Taking? Authorizing Provider  busPIRone (BUSPAR) 5 MG tablet Take 5 mg by mouth 2 (two) times daily. 7.5 in morning and 5 mg at night    [provider]  FLUoxetine (PROZAC) 20 MG capsule Take 20 mg by mouth daily. 08/10/20   [provider]  FLUoxetine (PROZAC) 40 MG capsule Take 10 mg by mouth. 60 mg total 07/30/20   [provider]  levocetirizine (XYZAL) 5 MG tablet Take 5 mg by mouth every evening.    [provider]  levonorgestrel (MIRENA) 20 MCG/24HR IUD  1 each by Intrauterine route once.    [provider]  metaxalone (SKELAXIN) 800 MG tablet Take 1 tablet (800 mg total) by mouth as needed for muscle spasms. #30/month 08/29/20   Glean Salvo, NP  MOUNJARO 2.5 MG/0.5ML Pen Inject 2.5 mg into the skin. 01/19/22   [provider]  naratriptan (AMERGE) 2.5 MG tablet Take 1 tablet (2.5 mg total) by mouth as needed for migraine. Take one (1) tablet at onset of headache; if returns or does not resolve, may repeat after 4 hours; do not exceed five (5) mg in 24 hours. 02/16/22   Levert Feinstein, MD  ondansetron (ZOFRAN-ODT) 4 MG disintegrating tablet Take 1 tablet (4 mg total) by mouth every 8 (eight) hours as needed for nausea or vomiting. 02/16/22   Levert Feinstein, MD  Rimegepant Sulfate (NURTEC) 75 MG TBDP Take 1 tab at onset of migraine.  May repeat in 2 hrs, if needed.  Max dose: 2 tabs/day. This is a 30 day prescription. 02/16/22   Levert Feinstein, MD  tiZANidine (ZANAFLEX) 4 MG tablet Take 1 tablet (4 mg total) by mouth every 6 (six) hours as needed for muscle spasms. 02/16/22   Levert Feinstein, MD  topiramate (TOPAMAX) 100 MG tablet Take 1 tablet (100 mg total) by mouth 2 (two) times daily. 02/16/22   Levert Feinstein, MD  verapamil (CALAN) 40 MG tablet TAKE 1 TABLET BY MOUTH EVERY DAY 01/12/22   Yates Decamp, MD  Allergies    Imitrex [sumatriptan] and Amoxicillin    Review of Systems   Review of Systems  Constitutional:  Positive for fever.  Cardiovascular:  Positive for chest pain.  Neurological:  Positive for numbness.  All other systems reviewed and are negative.   Physical Exam Updated Vital Signs BP (!) 125/99   Pulse 79   Temp 99.7 F (37.6 C) (Oral)   Resp (!) 24   Ht 5' 2.75" (1.594 m)   Wt 85.3 kg   SpO2 99%   BMI 33.57 kg/m  Physical Exam Vitals and nursing note reviewed.  Constitutional:      General: She is not in acute distress.    Appearance: Normal appearance.  HENT:     Head: Normocephalic and atraumatic.  Eyes:      General:        Right eye: No discharge.        Left eye: No discharge.  Cardiovascular:     Rate and Rhythm: Normal rate and regular rhythm.     Heart sounds: No murmur heard.    No friction rub. No gallop.  Pulmonary:     Effort: Pulmonary effort is normal. Tachypnea present.     Breath sounds: Normal breath sounds.  Chest:     Comments: No tenderness to palpation over the chest wall Abdominal:     General: Bowel sounds are normal.     Palpations: Abdomen is soft.  Skin:    General: Skin is warm and dry.     Capillary Refill: Capillary refill takes less than 2 seconds.  Neurological:     Mental Status: She is alert and oriented to person, place, and time.     Comments: Cranial nerves II through XII grossly intact.  Intact finger-nose, intact heel-to-shin.  Patient with some apparent hesitancy on Romberg but gait is normal.  Alert and oriented x3.  Moves all 4 limbs spontaneously, normal coordination.  No pronator drift.  Patient with apparent sensory discrepancies on the left side of the face, left arm, and left leg.  He has some decreased strength of the left leg but no apparent strength deficit on ambulation, or limp.  No significant strength deficit noted on the left arm.   Psychiatric:        Mood and Affect: Mood normal.        Behavior: Behavior normal.     ED Results / Procedures / Treatments   Labs (all labs ordered are listed, but only abnormal results are displayed) Labs Reviewed  BASIC METABOLIC PANEL - Abnormal; Notable for the following components:      Result Value   Sodium 134 (*)    CO2 19 (*)    Glucose, Bld 118 (*)    Calcium 8.5 (*)    All other components within normal limits  RESP PANEL BY RT-PCR (FLU A&B, COVID) ARPGX2  CBC  PREGNANCY, URINE  TROPONIN I (HIGH SENSITIVITY)  TROPONIN I (HIGH SENSITIVITY)    EKG EKG Interpretation  Date/Time:  Thursday July 02 2022 16:19:48 EDT Ventricular Rate:  82 PR Interval:  165 QRS Duration: 93 QT  Interval:  370 QTC Calculation: 433 R Axis:   60 Text Interpretation: Sinus rhythm Confirmed by Vonita Moss [id number]) on 07/02/2022 6:00:26 PM  Radiology CT Head Wo Contrast  Result Date: 07/02/2022 CLINICAL DATA:  Generalized pain and shortness of breath EXAM: CT HEAD WITHOUT CONTRAST TECHNIQUE: Contiguous axial images were obtained from the base of the skull through the vertex  without intravenous contrast. RADIATION DOSE REDUCTION: This exam was performed according to the departmental dose-optimization program which includes automated exposure control, adjustment of the mA and/or kV according to patient size and/or use of iterative reconstruction technique. COMPARISON:  Brain CT 06/23/2021 FINDINGS: Brain: No evidence of acute infarction, hemorrhage, hydrocephalus, extra-axial collection or mass lesion/mass effect. Vascular: No hyperdense vessel or unexpected calcification. Skull: Normal. Negative for fracture or focal lesion. Sinuses/Orbits: No acute finding. Other: None. IMPRESSION: No acute intracranial process. Electronically Signed   By: Annia Belt M.D.   On: 07/02/2022 17:30   DG Chest 2 View  Result Date: 07/02/2022 CLINICAL DATA:  Chest pain for 3 days. EXAM: CHEST - 2 VIEW COMPARISON:  None Available. FINDINGS: Normal cardiac and mediastinal contours. Heterogeneous opacities right lung base. No pleural effusion or pneumothorax. Thoracic spine degenerative changes. IMPRESSION: No active cardiopulmonary disease. Electronically Signed   By: Annia Belt M.D.   On: 07/02/2022 16:59    Procedures Procedures    Medications Ordered in ED Medications  ketorolac (TORADOL) 15 MG/ML injection 15 mg (15 mg Intravenous Given 07/02/22 1653)  morphine (PF) 2 MG/ML injection 2 mg (2 mg Intravenous Given 07/02/22 1655)    ED Course/ Medical Decision Making/ A&P Clinical Course as of 07/02/22 2250  Thu Jul 02, 2022  2000 Fever in children at home -- okay to follow up with neuro, maybe slightly  worsened symptoms secondary to URI. [CP]    Clinical Course User Index [CP] Olene Floss, PA-C                           Medical Decision Making Amount and/or Complexity of Data Reviewed Labs: ordered. Radiology: ordered.  Risk Prescription drug management.   This patient is a 41 y.o. female who presents to the ED for concern of chest pain, As well as some changes to her neurologic symptoms that been ongoing for greater than 1 month, this involves an extensive number of treatment options, and is a complaint that carries with it a high risk of complications and morbidity. The emergent differential diagnosis prior to evaluation includes, but is not limited to,  ACS, AAS, PE, Mallory-Weiss, Boerhaave's, Pneumonia, acute bronchitis, asthma or COPD exacerbation, anxiety, MSK pain or traumatic injury to the chest, acid reflux, additionally considered Guillain-Barr, meningitis, stroke, abnormal migraine presentation, MS presentation, spinal cord pathology, with her fever tachypnea considered to respiratory infection, COVID, flu, other unspecified viral URI versus other.   This is not an exhaustive differential.   Past Medical History / Co-morbidities / Social History: Previous history of migraine, anxiety, gestational diabetes, acid reflux  Additional history: Chart reviewed. Pertinent results include: Reviewed outpatient neurology, family medicine visits, including plan for patient to follow-up with neurology given a normal appearing MRI from around 1 month ago  Physical Exam: Physical exam performed. The pertinent findings include: Patient with some subjective sensory changes on the left side of the body to light and dull touch, as well as some weakness of the left leg compared to the other extremities.  She reports that her neurologic deficits are similar to previous.  She reports some increase sensation of numbness in the left arm but I do not appreciate any strength deficit.  She  had 1 episode of brief tachypnea prior to discharge with respirations of 24, otherwise no accessory breath sounds, respiratory distress, wheezing, stridor, rhonchi, other than this isolated measurement patient with no respiratory abnormality.  She has been  somewhat hypertensive during her evaluation with blood pressure max of 152/110.  She was febrile on arrival with temperature of 100.5 which improved to 99.7 after Toradol and rest.  Lab Tests: I ordered, and personally interpreted labs.  The pertinent results include: Troponin negative x2 in context of very atypical presentation of chest pain.  CBC unremarkable, no clinically significant leukocytosis, anemia.  RVP negative for COVID, flu, pregnancy test negative.  Her BMP shows a very mild hyponatremia with sodium 134.  She does have some decreased bicarb at 19 but no anion gap.  Glucose minimally elevated at 118 for nonfasting lab value.   Imaging Studies: I ordered imaging studies including CT head without contrast, plain film radiograph of chest. I independently visualized and interpreted imaging which showed no intracranial or intrathoracic abnormality. I agree with the radiologist interpretation.   Cardiac Monitoring:  The patient was maintained on a cardiac monitor.  My attending physician Dr. Eloise Harman viewed and interpreted the cardiac monitored which showed an underlying rhythm of: NSR. I agree with this interpretation.   Medications: I ordered medication including Toradol, morphine for pain. Reevaluation of the patient after these medicines showed that the patient improved. I have reviewed the patients home medicines and have made adjustments as needed.   Disposition: After consideration of the diagnostic results and the patients response to treatment, I feel that patient's presentation today is somewhat odd when taken in context with her ongoing neurologic symptoms, her new symptoms from her ongoing neurologic changes that she is still  undergoing work-up to include chest pain, left arm tingling, numbness.  At time of disposition she is not having significant chest pain but is still having some ongoing left arm and shoulder sensory changes.  She had a fever on arrival, notably with fever in both children at home.  I think that she will continue to need ongoing neurologic work-up to evaluate for possible cause of her neurologic abnormalities including potentially a repeat MRI of the brain, C-spine, T-spine to fully evaluate for MS, possible outpatient lumbar puncture to evaluate for Guillain-Barr or other CSF pathology.  This time however she has no meningismus, and otherwise reassuring work-up, acutely I am suspicious that she has an upper respiratory infection which led to some shortness of breath and chest pain.  She has minimal significant cardiac risk factors and no ischemic changes as well as reassuring troponin on her exam today and she is chest free at time of discharge.   emergency department workup does not suggest an emergent condition requiring admission or immediate intervention beyond what has been performed at this time. The plan is: As above follow-up with neurology, Motrin, Tylenol as needed for body aches, fever, chills. The patient is safe for discharge and has been instructed to return immediately for worsening symptoms, change in symptoms or any other concerns.  I discussed this case with my attending physician Dr. Eloise Harman who cosigned this note including patient's presenting symptoms, physical exam, and planned diagnostics and interventions. Attending physician stated agreement with plan or made changes to plan which were implemented.    Final Clinical Impression(s) / ED Diagnoses Final diagnoses:  Chest pain, unspecified type    Rx / DC Orders ED Discharge Orders     None         [place] 07/02/22 2250    Rondel Baton, MD 07/03/22 2022

## 2022-07-02 NOTE — ED Triage Notes (Signed)
Pt arrives to ED with c/o chest pain over the past three days. Associated symptoms include SOB, fatigue, and intermittent tingling/numbness.

## 2022-07-07 ENCOUNTER — Telehealth: Payer: Self-pay | Admitting: Neurology

## 2022-07-07 NOTE — Telephone Encounter (Signed)
LVM and sent mychart msg asking pt to call back to reschedule appointment with Dr. Terrace Arabia. I have held a spot with Sarah for 9/13 at 3:00 if pt is available for that time

## 2022-07-08 ENCOUNTER — Encounter: Payer: Self-pay | Admitting: Neurology

## 2022-07-08 ENCOUNTER — Ambulatory Visit: Payer: BC Managed Care – PPO | Admitting: Neurology

## 2022-07-08 VITALS — BP 135/96 | HR 67 | Ht 63.0 in | Wt 195.0 lb

## 2022-07-08 DIAGNOSIS — G43709 Chronic migraine without aura, not intractable, without status migrainosus: Secondary | ICD-10-CM

## 2022-07-08 DIAGNOSIS — R202 Paresthesia of skin: Secondary | ICD-10-CM | POA: Insufficient documentation

## 2022-07-08 MED ORDER — GABAPENTIN 300 MG PO CAPS: 300.0000 mg | ORAL_CAPSULE | Freq: Three times a day (TID) | ORAL | 3 refills | Status: DC

## 2022-07-08 NOTE — Patient Instructions (Addendum)
Check MRI of cervical spine  Try gabapentin 300 mg up to 3 times daily Start OTC B12 supplement 1000 mcg daily See you back in 6 months

## 2022-07-08 NOTE — Progress Notes (Signed)
Patient: Chelsea Frost Date of Birth: 11-29-1980  Reason for Visit: Follow up History from: Patient Primary Neurologist: Dr. Krista Blue   ASSESSMENT AND PLAN 41 y.o. year old female   1.  Chronic migraine headaches 2.  New onset paresthesia, nerve pain mostly to the left arm and leg, but can be on the right x 1 month  -Check MRI of the cervical spine with and without contrast, reflexes slightly increased to the left side  -MRI of the brain without contrast was normal at [facility] 06/16/22 -Try gabapentin up to 300 mg 3 times daily for paresthesia -B12 level was slightly low at 249, recommended OTC B12 1000 mcg daily supplement -ANA, CK, TSH, ESR, CRP were unremarkable -If MRI of cervical spine is unrevealing and symptoms persist may consider EMG/NCV -Follow-up in 6 months or sooner if needed  HISTORY  Chelsea Frost, is a 41 year old female, follow-up for migraine headaches   PMHX. Depression, polypharmacy treatment, Prozac 60 mg daily, BuSpar 5 mg twice a day, trazodone as needed  She had long history of migraine headaches, has been a patient of our clinic for a long time, previous MRI of the brain, MRA of the brain was normal  Recent CT head without contrast in August 2022 was normal  Her typical migraine lateralized retro-orbital's severe headache with associated light noise sensitivity, smell sensitivity, nauseous, lasting for few hours   Topamax works well as preventive medications, she was taking 100 mg every night, along with verapamil 40 mg daily, her headache was under good control for many years, unfortunately mother passed away in Jan 15, 2022, with increased stress, she complains of 3 major migraine headaches over the past 6 weeks, after taking Amerge, her headache will still last for few hours   In addition, she takes Fioricet as needed for some not so severe headaches,  Update July 08, 2022 SS: I reviewed notes from PCP 06/30/22 pain and SOB with moving after  hike, given Skelaxin.  Saw PCP again 06/05/22 for numbness from the left foot to the thigh, pins-and-needles, numbness in hands.  MRI of the brain was normal.  ANA was negative, vitamin D 30, TSH 1.400, CRP 2, sed rate 9, B12 249, A1C 5.0. 07/02/22 went to the ER, intermittent numbness and weakness, left-sided chest discomfort, aphasia.  Did have a fever, but had sick children at home.  CT head showed no acute process.   Here today for new issue. 1 months ago, numbness to left foot up to left knee intermittently. In Jan fell down stairs and hurt left foot. Then the numbness went to right leg, then both arms intermittent. Now the left leg is always present. Tingling in face periodically. Sometimes can't get her words out, knows what she wants to say. Has had tension headache, migraines have been under good control. Takes Topamax 100 mg in AM, with 100 mg BID felt too lethargic, Verapamil. Takes Amerge or Nurtec. On Mounjaro for 10 months, is now off. Her mother passed in 2023/01/15. 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   20 years ago similar symptoms, saw Dr. Brett Fairy, did PT, no etiology for symptoms.   Left leg always feels numb, tingling, intermittently left arm symptoms as well nerve like pain shooting. Her aunt had MS she is concerned about this. Her father has lupus anticoagulation.   REVIEW OF SYSTEMS: Out of a complete 14 system review of symptoms, the patient complains only of the following symptoms, and all other reviewed systems are negative.  See HPI  ALLERGIES: Allergies  Allergen Reactions   Imitrex [Sumatriptan] Other (See Comments)    Jaw issues    Amoxicillin Rash    Has patient had a PCN reaction causing immediate rash, facial/tongue/throat swelling, SOB or lightheadedness with hypotension: No Has patient had a PCN reaction causing severe rash involving mucus membranes or skin necrosis: No Has patient had a PCN reaction that required hospitalization: No Has patient had a PCN reaction occurring  within the last 10 years: Yes If all of the above answers are "NO", then may proceed with Cephalosporin use.     HOME MEDICATIONS: Outpatient Medications Prior to Visit  Medication Sig Dispense Refill   busPIRone (BUSPAR) 5 MG tablet Take 5 mg by mouth 2 (two) times daily. 7.5 in morning and 5 mg at night     FLUoxetine (PROZAC) 20 MG capsule Take 20 mg by mouth daily.     FLUoxetine (PROZAC) 40 MG capsule Take 10 mg by mouth. 60 mg total     levocetirizine (XYZAL) 5 MG tablet Take 5 mg by mouth every evening.     levonorgestrel (MIRENA) 20 MCG/24HR IUD 1 each by Intrauterine route once.     metaxalone (SKELAXIN) 800 MG tablet Take 1 tablet (800 mg total) by mouth as needed for muscle spasms. #30/month 30 tablet 3   naratriptan (AMERGE) 2.5 MG tablet Take 1 tablet (2.5 mg total) by mouth as needed for migraine. Take one (1) tablet at onset of headache; if returns or does not resolve, may repeat after 4 hours; do not exceed five (5) mg in 24 hours. 12 tablet 6   ondansetron (ZOFRAN-ODT) 4 MG disintegrating tablet Take 1 tablet (4 mg total) by mouth every 8 (eight) hours as needed for nausea or vomiting. 20 tablet 6   Rimegepant Sulfate (NURTEC) 75 MG TBDP Take 1 tab at onset of migraine.  May repeat in 2 hrs, if needed.  Max dose: 2 tabs/day. This is a 30 day prescription. 12 tablet 6   tiZANidine (ZANAFLEX) 4 MG tablet Take 1 tablet (4 mg total) by mouth every 6 (six) hours as needed for muscle spasms. 30 tablet 6   topiramate (TOPAMAX) 100 MG tablet Take 1 tablet (100 mg total) by mouth 2 (two) times daily. 180 tablet 3   verapamil (CALAN) 40 MG tablet TAKE 1 TABLET BY MOUTH EVERY DAY 90 tablet 3   MOUNJARO 2.5 MG/0.5ML Pen Inject 2.5 mg into the skin.     No facility-administered medications prior to visit.    PAST MEDICAL HISTORY: Past Medical History:  Diagnosis Date   Anxiety    Eczema    GERD (gastroesophageal reflux disease)    Gestational diabetes    glyburide   Gestational  diabetes mellitus (GDM), antepartum    Hx of varicella    Hypertension    labetolol 200 mg bid   Migraine     PAST SURGICAL HISTORY: Past Surgical History:  Procedure Laterality Date   BREAST REDUCTION SURGERY     KNEE SURGERY Left    WISDOM TOOTH EXTRACTION      FAMILY HISTORY: Family History  Problem Relation Age of Onset   Lupus Father        lupus anticoagulant   Pulmonary embolism Father    Hiatal hernia Father    Arrhythmia Father    Deep vein thrombosis Father    Migraines Father    GER disease Father    Multiple sclerosis Paternal Aunt    Lupus Paternal Aunt  COPD Maternal Grandmother    Cancer Maternal Grandfather        colon   Hypertension Mother    ADD / ADHD Sister     SOCIAL HISTORY: Social History   Socioeconomic History   Marital status: Married    Spouse name: Not on file   Number of children: 2   Years of education: Masters   Highest education level: Not on file  Occupational History   Occupation: Research scientist (life sciences)  Tobacco Use   Smoking status: Never    Passive exposure: Never   Smokeless tobacco: Never  Vaping Use   Vaping Use: Never used  Substance and Sexual Activity   Alcohol use: Yes    Comment: Occasional use   Drug use: No   Sexual activity: Yes    Comment: Mirena  Other Topics Concern   Not on file  Social History Narrative   Lives at home with her husband and child.   Right-handed.   Two 12oz bottles of Diet Dr. Malachi Bonds per day.   Social Determinants of Health   Financial Resource Strain: Not on file  Food Insecurity: Not on file  Transportation Needs: Not on file  Physical Activity: Not on file  Stress: Not on file  Social Connections: Not on file  Intimate Partner Violence: Not on file   PHYSICAL EXAM  Vitals:   07/08/22 1458  BP: (!) 135/96  Pulse: 67  Weight: 195 lb (88.5 kg)  Height: 5' 3"  (1.6 m)   Body mass index is 34.54 kg/m.  Generalized: Well developed, in no acute distress   Neurological examination  Mentation: Alert oriented to time, place, history taking. Follows all commands speech and language fluent Cranial nerve II-XII: Pupils were equal round reactive to light. Extraocular movements were full, visual field were full on confrontational test. Facial sensation and strength were normal. Head turning and shoulder shrug  were normal and symmetric. Motor: Normal uppers,strong grip strength, 4/5 left hip flexion, dorsiflexion is strong bilaterally Sensory: reports decreased soft touch sensation to left face, arm, leg Coordination: Cerebellar testing reveals good finger-nose-finger and heel-to-shin bilaterally, but is slower to perform with the left Gait and station: Has exaggerated limp to the left, tandem gait is steady with romberg she feels she will lean to the left no drift was noted  Reflexes: Deep tendon reflexes are symmetric but slightly increased to the left arm and leg  DIAGNOSTIC DATA (LABS, IMAGING, TESTING) - I reviewed patient records, labs, notes, testing and imaging myself where available.  Lab Results  Component Value Date   WBC 6.1 07/02/2022   HGB 13.7 07/02/2022   HCT 39.7 07/02/2022   MCV 88.6 07/02/2022   PLT 197 07/02/2022      Component Value Date/Time   NA 134 (L) 07/02/2022 1625   K 4.5 07/02/2022 1625   CL 105 07/02/2022 1625   CO2 19 (L) 07/02/2022 1625   GLUCOSE 118 (H) 07/02/2022 1625   BUN 6 07/02/2022 1625   CREATININE 0.89 07/02/2022 1625   CALCIUM 8.5 (L) 07/02/2022 1625   PROT 7.7 06/23/2021 2235   ALBUMIN 4.3 06/23/2021 2235   AST 18 06/23/2021 2235   ALT 13 06/23/2021 2235   ALKPHOS 43 06/23/2021 2235   BILITOT 0.5 06/23/2021 2235   GFRNONAA >60 07/02/2022 1625   GFRAA >60 03/10/2018 2333   No results found for: "CHOL", "HDL", "LDLCALC", "LDLDIRECT", "TRIG", "CHOLHDL" No results found for: "HGBA1C" No results found for: "VITAMINB12" No results found for: "TSH"  Butler Denmark, AGNP-C, DNP 07/08/2022, 3:39  PM [facility] [address], [place], [place] [address] [phone]

## 2022-07-09 ENCOUNTER — Ambulatory Visit: Payer: BC Managed Care – PPO | Admitting: Neurology

## 2022-07-15 ENCOUNTER — Ambulatory Visit: Payer: BC Managed Care – PPO

## 2022-07-15 DIAGNOSIS — R202 Paresthesia of skin: Secondary | ICD-10-CM

## 2022-07-15 MED ORDER — GADOBENATE DIMEGLUMINE 529 MG/ML IV SOLN
15.0000 mL | Freq: Once | INTRAVENOUS | Status: AC | PRN
  Administered 2022-07-15: 15 mL via INTRAVENOUS

## 2022-07-21 ENCOUNTER — Encounter: Payer: Self-pay | Admitting: Neurology

## 2022-08-18 ENCOUNTER — Ambulatory Visit: Payer: BC Managed Care – PPO | Admitting: Neurology

## 2022-09-22 ENCOUNTER — Encounter: Payer: Self-pay | Admitting: Neurology

## 2022-10-11 ENCOUNTER — Encounter: Payer: Self-pay | Admitting: Neurology

## 2022-10-12 MED ORDER — METAXALONE 800 MG PO TABS: 800.0000 mg | ORAL_TABLET | ORAL | 3 refills | Status: DC | PRN

## 2023-01-19 NOTE — Progress Notes (Deleted)
Patient: Chelsea Frost Date of Birth: [birth year]  Reason for Visit: Follow up History from: Patient Primary Neurologist: Dr. Krista Blue   ASSESSMENT AND PLAN 42 y.o. year old female   1.  Chronic migraine headaches 2.  New onset paresthesia, nerve pain mostly to the left arm and leg, but can be on the right x 1 month  -Check MRI of the cervical spine with and without contrast, reflexes slightly increased to the left side  -MRI of the brain without contrast was normal at [facility] 06/16/22 -Try gabapentin up to 300 mg 3 times daily for paresthesia -B12 level was slightly low at 249, recommended OTC B12 1000 mcg daily supplement -ANA, CK, TSH, ESR, CRP were unremarkable -If MRI of cervical spine is unrevealing and symptoms persist may consider EMG/NCV -Follow-up in 6 months or sooner if needed  HISTORY  Chelsea Frost, is a 42 year old female, follow-up for migraine headaches   PMHX. Depression, polypharmacy treatment, Prozac 60 mg daily, BuSpar 5 mg twice a day, trazodone as needed  She had long history of migraine headaches, has been a patient of our clinic for a long time, previous MRI of the brain, MRA of the brain was normal  Recent CT head without contrast in August 2022 was normal  Her typical migraine lateralized retro-orbital's severe headache with associated light noise sensitivity, smell sensitivity, nauseous, lasting for few hours   Topamax works well as preventive medications, she was taking 100 mg every night, along with verapamil 40 mg daily, her headache was under good control for many years, unfortunately mother passed away in 2022-01-11, with increased stress, she complains of 3 major migraine headaches over the past 6 Frost, after taking Amerge, her headache will still last for few hours   In addition, she takes Fioricet as needed for some not so severe headaches,  Update July 08, 2022 SS: I reviewed notes from PCP 06/30/22 pain and SOB with moving after  hike, given Skelaxin.  Saw PCP again 06/05/22 for numbness from the left foot to the thigh, pins-and-needles, numbness in hands.  MRI of the brain was normal.  ANA was negative, vitamin D 30, TSH 1.400, CRP 2, sed rate 9, B12 249, A1C 5.0. 07/02/22 went to the ER, intermittent numbness and weakness, left-sided chest discomfort, aphasia.  Did have a fever, but had sick children at home.  CT head showed no acute process.   Here today for new issue. 1 months ago, numbness to left foot up to left knee intermittently. In Jan fell down stairs and hurt left foot. Then the numbness went to right leg, then both arms intermittent. Now the left leg is always present. Tingling in face periodically. Sometimes can't get her words out, knows what she wants to say. Has had tension headache, migraines have been under good control. Takes Topamax 100 mg in AM, with 100 mg BID felt too lethargic, Verapamil. Takes Amerge or Nurtec. On Mounjaro for 10 months, is now off. Her mother passed in February. 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   20 years ago similar symptoms, saw Dr. Brett Fairy, did PT, no etiology for symptoms.   Left leg always feels numb, tingling, intermittently left arm symptoms as well nerve like pain shooting. Her aunt had MS she is concerned about this. Her father has lupus anticoagulation.   Update January 20, 2023 SS: MRI cervical spine September 2023 was normal, mild degenerative changes C5-6.  REVIEW OF SYSTEMS: Out of a complete 14 system review of symptoms, the  patient complains only of the following symptoms, and all other reviewed systems are negative.  See HPI  ALLERGIES: Allergies  Allergen Reactions   Imitrex [Sumatriptan] Other (See Comments)    Jaw issues    Amoxicillin Rash    Has patient had a PCN reaction causing immediate rash, facial/tongue/throat swelling, SOB or lightheadedness with hypotension: No Has patient had a PCN reaction causing severe rash involving mucus membranes or skin necrosis:  No Has patient had a PCN reaction that required hospitalization: No Has patient had a PCN reaction occurring within the last 10 years: Yes If all of the above answers are "NO", then may proceed with Cephalosporin use.     HOME MEDICATIONS: Outpatient Medications Prior to Visit  Medication Sig Dispense Refill   busPIRone (BUSPAR) 5 MG tablet Take 5 mg by mouth 2 (two) times daily. 7.5 in morning and 5 mg at night     FLUoxetine (PROZAC) 20 MG capsule Take 20 mg by mouth daily.     FLUoxetine (PROZAC) 40 MG capsule Take 10 mg by mouth. 60 mg total     gabapentin (NEURONTIN) 300 MG capsule Take 1 capsule (300 mg total) by mouth 3 (three) times daily. 90 capsule 3   levocetirizine (XYZAL) 5 MG tablet Take 5 mg by mouth every evening.     levonorgestrel (MIRENA) 20 MCG/24HR IUD 1 each by Intrauterine route once.     metaxalone (SKELAXIN) 800 MG tablet Take 1 tablet (800 mg total) by mouth as needed for muscle spasms. #30/month 30 tablet 3   naratriptan (AMERGE) 2.5 MG tablet Take 1 tablet (2.5 mg total) by mouth as needed for migraine. Take one (1) tablet at onset of headache; if returns or does not resolve, may repeat after 4 hours; do not exceed five (5) mg in 24 hours. 12 tablet 6   ondansetron (ZOFRAN-ODT) 4 MG disintegrating tablet Take 1 tablet (4 mg total) by mouth every 8 (eight) hours as needed for nausea or vomiting. 20 tablet 6   Rimegepant Sulfate (NURTEC) 75 MG TBDP Take 1 tab at onset of migraine.  May repeat in 2 hrs, if needed.  Max dose: 2 tabs/day. This is a 30 day prescription. 12 tablet 6   tiZANidine (ZANAFLEX) 4 MG tablet Take 1 tablet (4 mg total) by mouth every 6 (six) hours as needed for muscle spasms. 30 tablet 6   topiramate (TOPAMAX) 100 MG tablet Take 1 tablet (100 mg total) by mouth 2 (two) times daily. 180 tablet 3   verapamil (CALAN) 40 MG tablet TAKE 1 TABLET BY MOUTH EVERY DAY 90 tablet 3   No facility-administered medications prior to visit.    PAST MEDICAL  HISTORY: Past Medical History:  Diagnosis Date   Anxiety    Eczema    GERD (gastroesophageal reflux disease)    Gestational diabetes    glyburide   Gestational diabetes mellitus (GDM), antepartum    Hx of varicella    Hypertension    labetolol 200 mg bid   Migraine     PAST SURGICAL HISTORY: Past Surgical History:  Procedure Laterality Date   BREAST REDUCTION SURGERY     KNEE SURGERY Left    WISDOM TOOTH EXTRACTION      FAMILY HISTORY: Family History  Problem Relation Age of Onset   Lupus Father        lupus anticoagulant   Pulmonary embolism Father    Hiatal hernia Father    Arrhythmia Father    Deep vein thrombosis  Father    Migraines Father    GER disease Father    Multiple sclerosis Paternal Aunt    Lupus Paternal Aunt    COPD Maternal Grandmother    Cancer Maternal Grandfather        colon   Hypertension Mother    ADD / ADHD Sister     SOCIAL HISTORY: Social History   Socioeconomic History   Marital status: Married    Spouse name: Not on file   Number of children: 2   Years of education: Masters   Highest education level: Not on file  Occupational History   Occupation: Research scientist (life sciences)  Tobacco Use   Smoking status: Never    Passive exposure: Never   Smokeless tobacco: Never  Vaping Use   Vaping Use: Never used  Substance and Sexual Activity   Alcohol use: Yes    Comment: Occasional use   Drug use: No   Sexual activity: Yes    Comment: Mirena  Other Topics Concern   Not on file  Social History Narrative   Lives at home with her husband and child.   Right-handed.   Two 12oz bottles of Diet Dr. Malachi Bonds per day.   Social Determinants of Health   Financial Resource Strain: Not on file  Food Insecurity: Not on file  Transportation Needs: Not on file  Physical Activity: Not on file  Stress: Not on file  Social Connections: Not on file  Intimate Partner Violence: Not on file   PHYSICAL EXAM  There were no vitals filed for  this visit.  There is no height or weight on file to calculate BMI.  Generalized: Well developed, in no acute distress  Neurological examination  Mentation: Alert oriented to time, place, history taking. Follows all commands speech and language fluent Cranial nerve II-XII: Pupils were equal round reactive to light. Extraocular movements were full, visual field were full on confrontational test. Facial sensation and strength were normal. Head turning and shoulder shrug  were normal and symmetric. Motor: Normal uppers,strong grip strength, 4/5 left hip flexion, dorsiflexion is strong bilaterally Sensory: reports decreased soft touch sensation to left face, arm, leg Coordination: Cerebellar testing reveals good finger-nose-finger and heel-to-shin bilaterally, but is slower to perform with the left Gait and station: Has exaggerated limp to the left, tandem gait is steady with romberg she feels she will lean to the left no drift was noted  Reflexes: Deep tendon reflexes are symmetric but slightly increased to the left arm and leg  DIAGNOSTIC DATA (LABS, IMAGING, TESTING) - I reviewed patient records, labs, notes, testing and imaging myself where available.  Lab Results  Component Value Date   WBC 6.1 07/02/2022   HGB 13.7 07/02/2022   HCT 39.7 07/02/2022   MCV 88.6 07/02/2022   PLT 197 07/02/2022      Component Value Date/Time   NA 134 (L) 07/02/2022 1625   K 4.5 07/02/2022 1625   CL 105 07/02/2022 1625   CO2 19 (L) 07/02/2022 1625   GLUCOSE 118 (H) 07/02/2022 1625   BUN 6 07/02/2022 1625   CREATININE 0.89 07/02/2022 1625   CALCIUM 8.5 (L) 07/02/2022 1625   PROT 7.7 06/23/2021 2235   ALBUMIN 4.3 06/23/2021 2235   AST 18 06/23/2021 2235   ALT 13 06/23/2021 2235   ALKPHOS 43 06/23/2021 2235   BILITOT 0.5 06/23/2021 2235   GFRNONAA >60 07/02/2022 1625   GFRAA >60 03/10/2018 2333   No results found for: "CHOL", "HDL", "LDLCALC", "LDLDIRECT", "TRIG", "CHOLHDL"  No results found for:  "HGBA1C" No results found for: "VITAMINB12" No results found for: "TSH"  Butler Denmark, AGNP-C, DNP 01/19/2023, 8:19 PM [facility] [address], [place], [place] [address] [phone]

## 2023-01-20 ENCOUNTER — Ambulatory Visit: Payer: BC Managed Care – PPO | Admitting: Neurology

## 2023-01-20 ENCOUNTER — Encounter: Payer: Self-pay | Admitting: Neurology

## 2023-02-17 NOTE — Progress Notes (Unsigned)
Patient: Chelsea Frost Date of Birth: January 05, 1981  Reason for Visit: Follow up History from: Patient Primary Neurologist: Dr. Terrace Arabia   ASSESSMENT AND PLAN 42 y.o. year old female   1.  Chronic migraine headaches 2.  Paresthesia, left-sided -5 migraines a month, 10-15 headache days -Start Ajovy 225 mg monthly injection for migraine prevention -Continue current cocktail for migraine rescue: Amerge, Nurtec, tizanidine, Skelaxin, Zofran, Aleve.  Utilizes for different type of headache -For now, continue Topamax 100 mg daily, higher doses have resulted in side effects, hopefully will be able to wean off Topamax once Ajovy is on board -Has gabapentin for occasional left-sided paresthesia, essentially resolved -Is not planning pregnancy, has IUD, we discussed pregnancy not recommended with injectable CGRP, advised being off CGRP for 6 months prior to conception -Follow-up 6 months virtually or sooner if needed  Meds ordered this encounter  Medications   Fremanezumab-vfrm (AJOVY) 225 MG/1.5ML SOAJ    Sig: Inject 225 mg into the skin every 30 (thirty) days.    Dispense:  1.68 mL    Refill:  11   topiramate (TOPAMAX) 100 MG tablet    Sig: Take 1 tablet (100 mg total) by mouth 2 (two) times daily.    Dispense:  180 tablet    Refill:  3   Rimegepant Sulfate (NURTEC) 75 MG TBDP    Sig: Take 1 tab at onset of migraine.  May repeat in 2 hrs, if needed.  Max dose: 2 tabs/day. This is a 30 day prescription.    Dispense:  12 tablet    Refill:  6   ondansetron (ZOFRAN-ODT) 4 MG disintegrating tablet    Sig: Take 1 tablet (4 mg total) by mouth every 8 (eight) hours as needed for nausea or vomiting.    Dispense:  20 tablet    Refill:  3   naratriptan (AMERGE) 2.5 MG tablet    Sig: Take 1 tablet (2.5 mg total) by mouth as needed for migraine. Take one (1) tablet at onset of headache; if returns or does not resolve, may repeat after 4 hours; do not exceed five (5) mg in 24 hours.    Dispense:   12 tablet    Refill:  6   metaxalone (SKELAXIN) 800 MG tablet    Sig: Take 1 tablet (800 mg total) by mouth as needed for muscle spasms. #30/month    Dispense:  30 tablet    Refill:  3   HISTORY  Chelsea Frost, is a 42 year old female, follow-up for migraine headaches   PMHX. Depression, polypharmacy treatment, Prozac 60 mg daily, BuSpar 5 mg twice a day, trazodone as needed  She had long history of migraine headaches, has been a patient of our clinic for a long time, previous MRI of the brain, MRA of the brain was normal  Recent CT head without contrast in August 2022 was normal  Her typical migraine lateralized retro-orbital's severe headache with associated light noise sensitivity, smell sensitivity, nauseous, lasting for few hours   Topamax works well as preventive medications, she was taking 100 mg every night, along with verapamil 40 mg daily, her headache was under good control for many years, unfortunately mother passed away in 02/20/2023with increased stress, she complains of 3 major migraine headaches over the past 6 weeks, after taking Amerge, her headache will still last for few hours   In addition, she takes Fioricet as needed for some not so severe headaches,  Update July 08, 2022 SS: I reviewed  notes from PCP 06/30/22 pain and SOB with moving after hike, given Skelaxin.  Saw PCP again 06/05/22 for numbness from the left foot to the thigh, pins-and-needles, numbness in hands.  MRI of the brain was normal.  ANA was negative, vitamin D 30, TSH 1.400, CRP 2, sed rate 9, B12 249, A1C 5.0. 07/02/22 went to the ER, intermittent numbness and weakness, left-sided chest discomfort, aphasia.  Did have a fever, but had sick children at home.  CT head showed no acute process.   Here today for new issue. 1 months ago, numbness to left foot up to left knee intermittently. In Jan fell down stairs and hurt left foot. Then the numbness went to right leg, then both arms intermittent.  Now the left leg is always present. Tingling in face periodically. Sometimes can't get her words out, knows what she wants to say. Has had tension headache, migraines have been under good control. Takes Topamax 100 mg in AM, with 100 mg BID felt too lethargic, Verapamil. Takes Amerge or Nurtec. On Mounjaro for 10 months, is now off. Her mother passed in February. Works as Advertising account planner SLP.   20 years ago similar symptoms, saw Dr. Vickey Huger, did PT, no etiology for symptoms.   Left leg always feels numb, tingling, intermittently left arm symptoms as well nerve like pain shooting. Her aunt had MS she is concerned about this. Her father has lupus anticoagulation.   Update February 18, 2023 SS: MRI cervical spine September 2023 was normal, mild degenerative changes C5-6. 5 migraines in the last month, headache days 10-15 days, takes Aleve, or Skelexin if tends to be tension headache. Getting new glasses helped.  Remains on Topamax 100 mg once daily, doesn't remember to take twice daily. Has Amerge, Nurtec, tizanidine if needed. Some nausea come back with migraines. Taking gabapentin as needed for random numb limb, mostly on the left. Higher doses of Topamax at 1 time resulted in brain fog.   REVIEW OF SYSTEMS: Out of a complete 14 system review of symptoms, the patient complains only of the following symptoms, and all other reviewed systems are negative.  See HPI  ALLERGIES: Allergies  Allergen Reactions   Imitrex [Sumatriptan] Other (See Comments)    Jaw issues    Amoxicillin Rash    Has patient had a PCN reaction causing immediate rash, facial/tongue/throat swelling, SOB or lightheadedness with hypotension: No Has patient had a PCN reaction causing severe rash involving mucus membranes or skin necrosis: No Has patient had a PCN reaction that required hospitalization: No Has patient had a PCN reaction occurring within the last 10 years: Yes If all of the above answers are "NO", then may proceed with  Cephalosporin use.     HOME MEDICATIONS: Outpatient Medications Prior to Visit  Medication Sig Dispense Refill   busPIRone (BUSPAR) 5 MG tablet Take 5 mg by mouth 2 (two) times daily. 7.5 in morning and 5 mg at night     dicyclomine (BENTYL) 20 MG tablet Take 20 mg by mouth daily as needed for spasms.     FLUoxetine (PROZAC) 20 MG capsule Take 20 mg by mouth daily.     FLUoxetine (PROZAC) 40 MG capsule Take 10 mg by mouth. 60 mg total     gabapentin (NEURONTIN) 300 MG capsule Take 1 capsule (300 mg total) by mouth 3 (three) times daily. 90 capsule 3   levocetirizine (XYZAL) 5 MG tablet Take 5 mg by mouth every evening.     levonorgestrel (MIRENA) 20 MCG/24HR  IUD 1 each by Intrauterine route once.     tiZANidine (ZANAFLEX) 4 MG tablet Take 1 tablet (4 mg total) by mouth every 6 (six) hours as needed for muscle spasms. 30 tablet 6   metaxalone (SKELAXIN) 800 MG tablet Take 1 tablet (800 mg total) by mouth as needed for muscle spasms. #30/month 30 tablet 3   naratriptan (AMERGE) 2.5 MG tablet Take 1 tablet (2.5 mg total) by mouth as needed for migraine. Take one (1) tablet at onset of headache; if returns or does not resolve, may repeat after 4 hours; do not exceed five (5) mg in 24 hours. 12 tablet 6   ondansetron (ZOFRAN-ODT) 4 MG disintegrating tablet Take 1 tablet (4 mg total) by mouth every 8 (eight) hours as needed for nausea or vomiting. 20 tablet 6   Rimegepant Sulfate (NURTEC) 75 MG TBDP Take 1 tab at onset of migraine.  May repeat in 2 hrs, if needed.  Max dose: 2 tabs/day. This is a 30 day prescription. 12 tablet 6   topiramate (TOPAMAX) 100 MG tablet Take 1 tablet (100 mg total) by mouth 2 (two) times daily. 180 tablet 3   verapamil (CALAN) 40 MG tablet TAKE 1 TABLET BY MOUTH EVERY DAY (Patient not taking: Reported on 02/18/2023) 90 tablet 3   No facility-administered medications prior to visit.    PAST MEDICAL HISTORY: Past Medical History:  Diagnosis Date   Anxiety    Eczema     GERD (gastroesophageal reflux disease)    Gestational diabetes    glyburide   Gestational diabetes mellitus (GDM), antepartum    Hx of varicella    Hypertension    labetolol 200 mg bid   Migraine     PAST SURGICAL HISTORY: Past Surgical History:  Procedure Laterality Date   BREAST REDUCTION SURGERY     KNEE SURGERY Left    WISDOM TOOTH EXTRACTION      FAMILY HISTORY: Family History  Problem Relation Age of Onset   Lupus Father        lupus anticoagulant   Pulmonary embolism Father    Hiatal hernia Father    Arrhythmia Father    Deep vein thrombosis Father    Migraines Father    GER disease Father    Multiple sclerosis Paternal Aunt    Lupus Paternal Aunt    COPD Maternal Grandmother    Cancer Maternal Grandfather        colon   Hypertension Mother    ADD / ADHD Sister     SOCIAL HISTORY: Social History   Socioeconomic History   Marital status: Married    Spouse name: Not on file   Number of children: 2   Years of education: Masters   Highest education level: Not on file  Occupational History   Occupation: Radiation protection practitioner  Tobacco Use   Smoking status: Never    Passive exposure: Never   Smokeless tobacco: Never  Vaping Use   Vaping Use: Never used  Substance and Sexual Activity   Alcohol use: Yes    Alcohol/week: 5.0 standard drinks of alcohol    Types: 5 Standard drinks or equivalent per week    Comment: Occasional use   Drug use: No   Sexual activity: Yes    Comment: Mirena  Other Topics Concern   Not on file  Social History Narrative   Lives at home with her husband and child.   Right-handed.   Two 12oz bottles of Diet Dr. Reino Kent per day.  Social Determinants of Health   Financial Resource Strain: Not on file  Food Insecurity: Not on file  Transportation Needs: Not on file  Physical Activity: Not on file  Stress: Not on file  Social Connections: Not on file  Intimate Partner Violence: Not on file   PHYSICAL  EXAM  Vitals:   02/18/23 0801  BP: 119/84  Pulse: 86  Weight: 196 lb 6.4 oz (89.1 kg)  Height: 5\' 3"  (1.6 m)   Body mass index is 34.79 kg/m.  Generalized: Well developed, in no acute distress  Neurological examination  Mentation: Alert oriented to time, place, history taking. Follows all commands speech and language fluent Cranial nerve II-XII: Pupils were equal round reactive to light. Extraocular movements were full, visual field were full on confrontational test. Facial sensation and strength were normal. Head turning and shoulder shrug  were normal and symmetric. Motor: Normal 5/5 all 4 extremities. Sensory: normal Coordination: Cerebellar testing reveals good finger-nose-finger and heel-to-shin bilaterally, but is slower to perform with the left Gait and station: Gait is normal, tandem gait is normal.  DIAGNOSTIC DATA (LABS, IMAGING, TESTING) - I reviewed patient records, labs, notes, testing and imaging myself where available.  Lab Results  Component Value Date   WBC 6.1 07/02/2022   HGB 13.7 07/02/2022   HCT 39.7 07/02/2022   MCV 88.6 07/02/2022   PLT 197 07/02/2022      Component Value Date/Time   NA 134 (L) 07/02/2022 1625   K 4.5 07/02/2022 1625   CL 105 07/02/2022 1625   CO2 19 (L) 07/02/2022 1625   GLUCOSE 118 (H) 07/02/2022 1625   BUN 6 07/02/2022 1625   CREATININE 0.89 07/02/2022 1625   CALCIUM 8.5 (L) 07/02/2022 1625   PROT 7.7 06/23/2021 2235   ALBUMIN 4.3 06/23/2021 2235   AST 18 06/23/2021 2235   ALT 13 06/23/2021 2235   ALKPHOS 43 06/23/2021 2235   BILITOT 0.5 06/23/2021 2235   GFRNONAA >60 07/02/2022 1625   GFRAA >60 03/10/2018 2333   No results found for: "CHOL", "HDL", "LDLCALC", "LDLDIRECT", "TRIG", "CHOLHDL" No results found for: "HGBA1C" No results found for: "VITAMINB12" No results found for: "TSH"  Margie Ege, AGNP-C, DNP 02/18/2023, 8:48 AM [facility] [address], [address] [place], [place] [address] [phone]

## 2023-02-18 ENCOUNTER — Ambulatory Visit: Payer: BC Managed Care – PPO | Admitting: Neurology

## 2023-02-18 ENCOUNTER — Encounter: Payer: Self-pay | Admitting: Neurology

## 2023-02-18 VITALS — BP 119/84 | HR 86 | Ht 63.0 in | Wt 196.4 lb

## 2023-02-18 DIAGNOSIS — R202 Paresthesia of skin: Secondary | ICD-10-CM | POA: Diagnosis not present

## 2023-02-18 DIAGNOSIS — G43709 Chronic migraine without aura, not intractable, without status migrainosus: Secondary | ICD-10-CM

## 2023-02-18 MED ORDER — METAXALONE 800 MG PO TABS: 800.0000 mg | ORAL_TABLET | ORAL | 3 refills | Status: DC | PRN

## 2023-02-18 MED ORDER — NARATRIPTAN HCL 2.5 MG PO TABS
2.5000 mg | ORAL_TABLET | ORAL | 6 refills | Status: DC | PRN
Start: 2023-02-18 — End: 2024-01-21

## 2023-02-18 MED ORDER — TOPIRAMATE 100 MG PO TABS: 100.0000 mg | ORAL_TABLET | Freq: Two times a day (BID) | ORAL | 3 refills | Status: DC

## 2023-02-18 MED ORDER — AJOVY 225 MG/1.5ML ~~LOC~~ SOAJ: 225.0000 mg | SUBCUTANEOUS | 11 refills | Status: DC

## 2023-02-18 MED ORDER — NURTEC 75 MG PO TBDP
ORAL_TABLET | ORAL | 6 refills | Status: DC
Start: 2023-02-18 — End: 2023-12-01

## 2023-02-18 MED ORDER — ONDANSETRON 4 MG PO TBDP
4.0000 mg | ORAL_TABLET | Freq: Three times a day (TID) | ORAL | 3 refills | Status: DC | PRN
Start: 2023-02-18 — End: 2024-06-05

## 2023-02-18 NOTE — Patient Instructions (Signed)
Try Ajovy once a month injection for migraine prevention, give 3-4 months to see full benefit  For now, continue the Topamax but we will plan to wean off Keep other rescue medications Use gabapentin as needed

## 2023-03-24 IMAGING — CT CT HEAD W/O CM
4 series · 17 of 47 positions shown, 19 images · non-contrast
Comparison: October 21, 2013

CLINICAL DATA: Headache.

EXAM:
CT HEAD WITHOUT CONTRAST
TECHNIQUE: Contiguous axial images were obtained from the base of the skull
through the vertex without intravenous contrast.

[Series 2: head wo · axial · 0.44mm/px · z∈[-290,-170]mm · 7 of 32 slices shown, 9 images]
[im 4/32  brain]
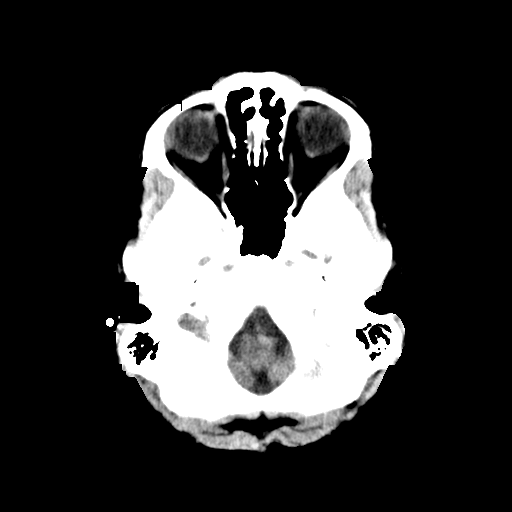
[im 4/32  bone]
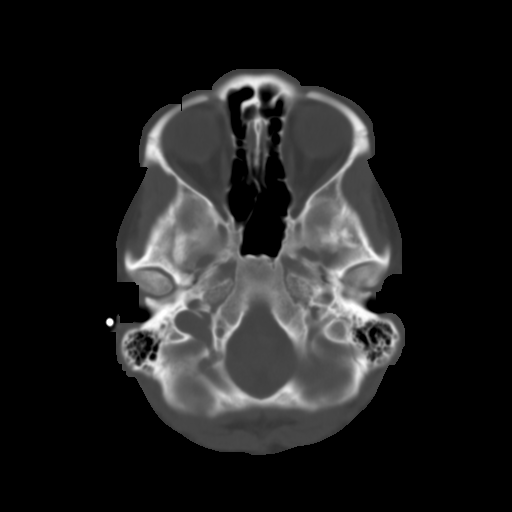
[im 8/32  brain]
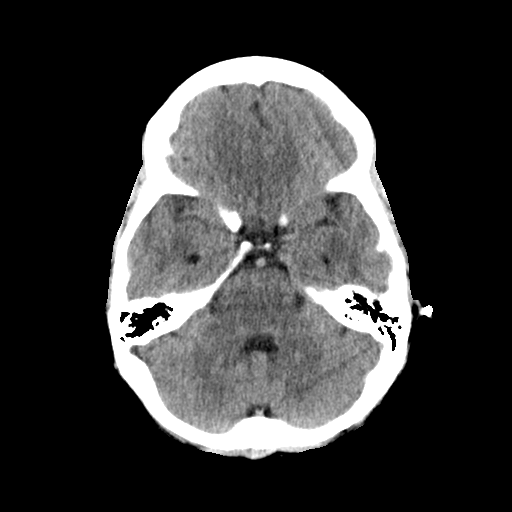
[im 12/32  brain]
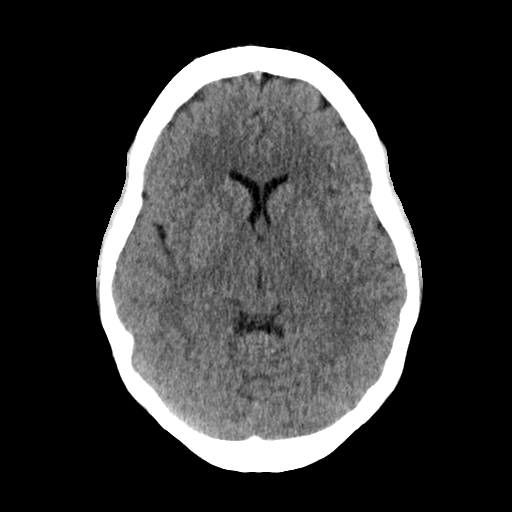
[im 16/32  brain]
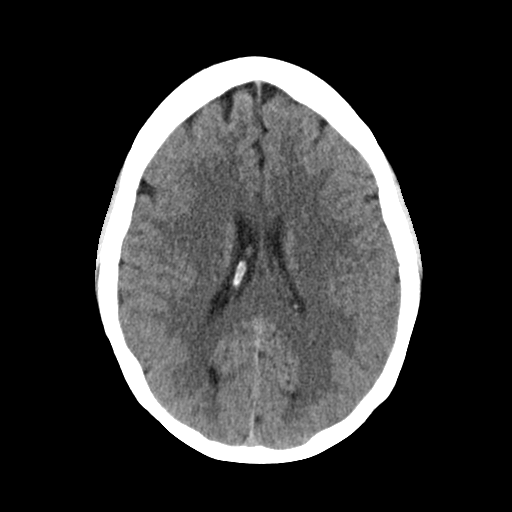
[im 20/32  brain]
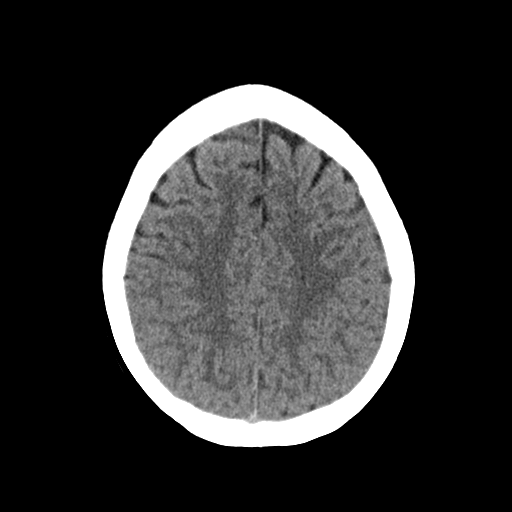
[im 20/32  bone]
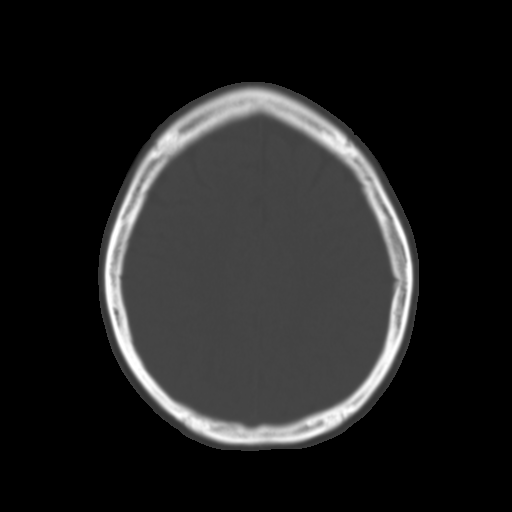
[im 24/32  brain]
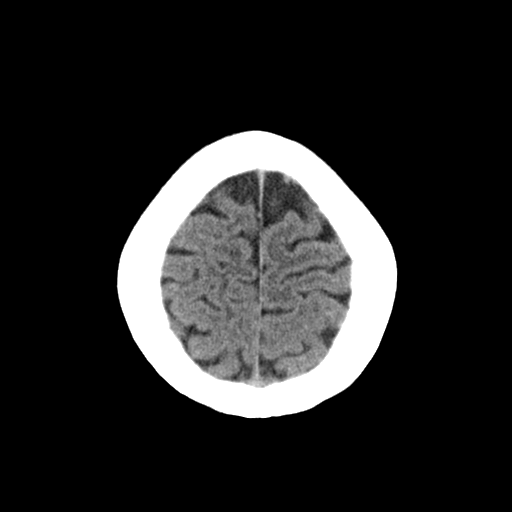
[im 28/32  brain]
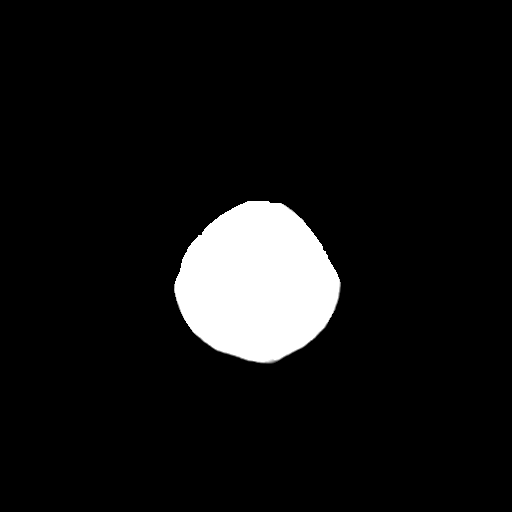

[Series 3: head bone · axial · 0.44mm/px · z∈[-291,-235]mm · 4 of 79 slices shown]
[im 8/79  bone]
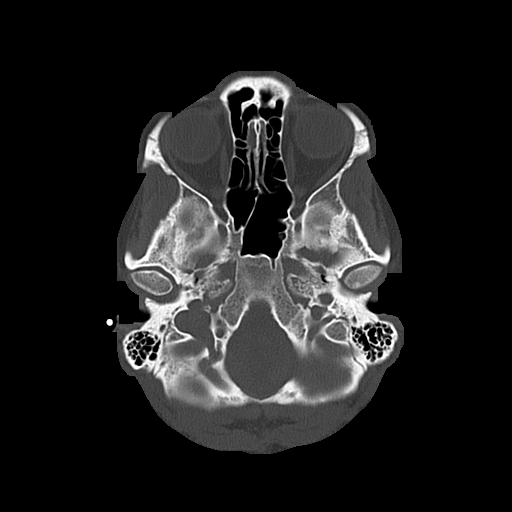
[im 16/79  bone]
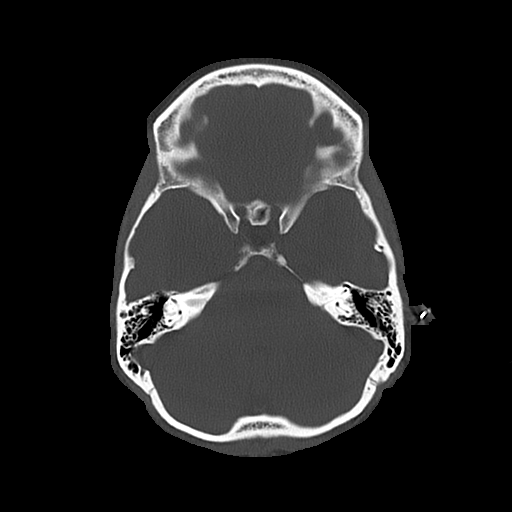
[im 24/79  bone]
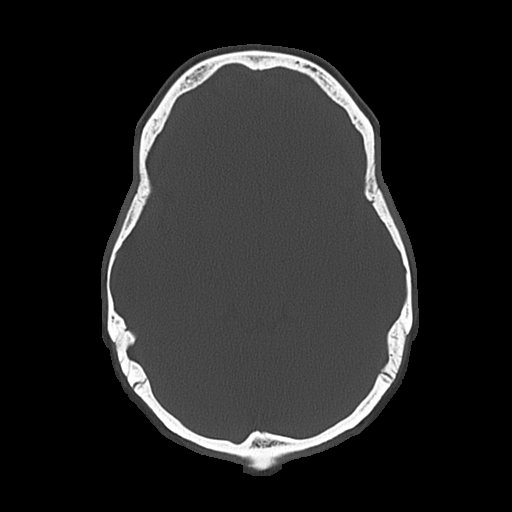
[im 36/79  bone]
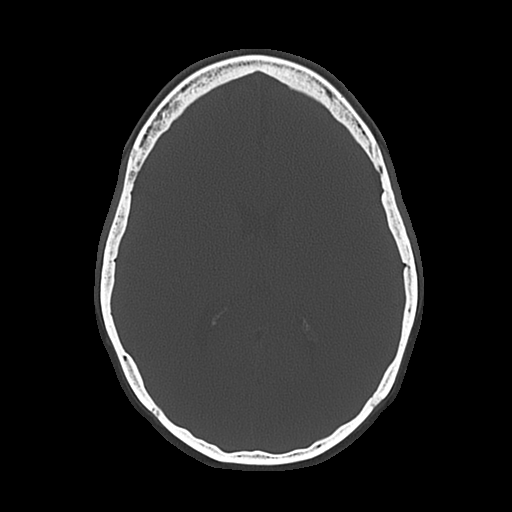

[Series 4: coronal soft · coronal · 0.34mm/px · 3 of 67 slices shown]
[im 23/67  brain]
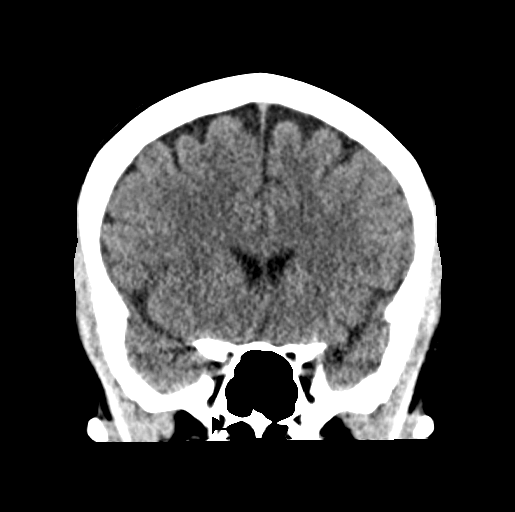
[im 30/67  brain]
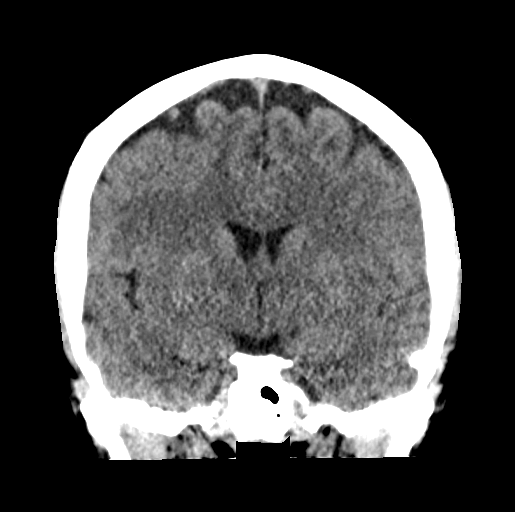
[im 37/67  brain]
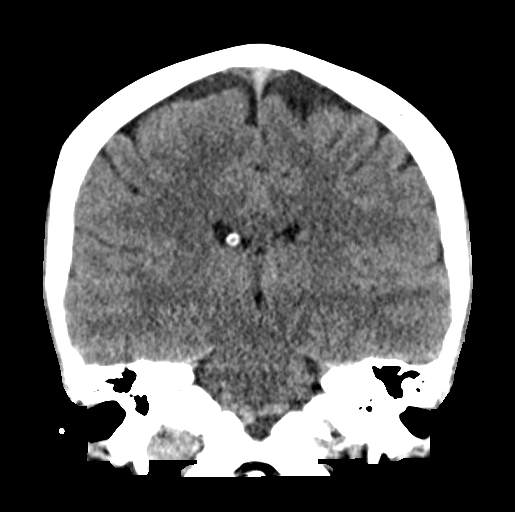

[Series 5: sagittal soft · sagittal · 0.35mm/px · 3 of 58 slices shown]
[im 20/58  brain]
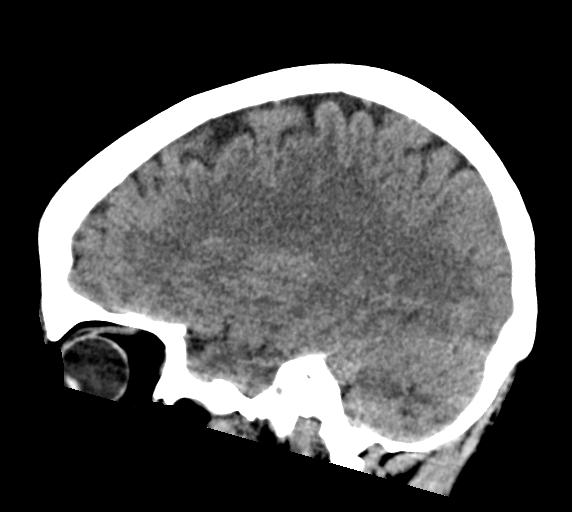
[im 29/58  brain]
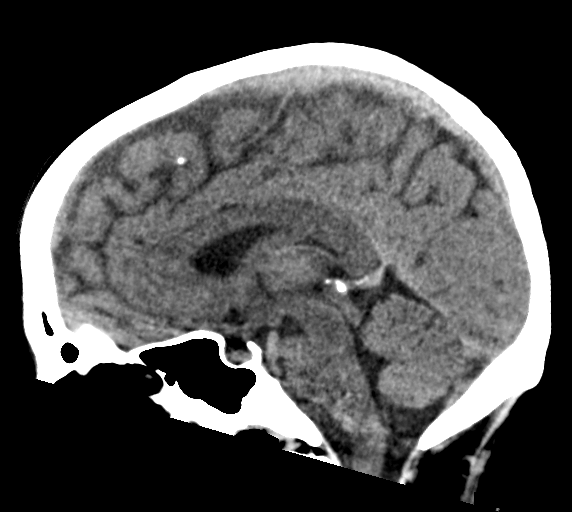
[im 39/58  brain]
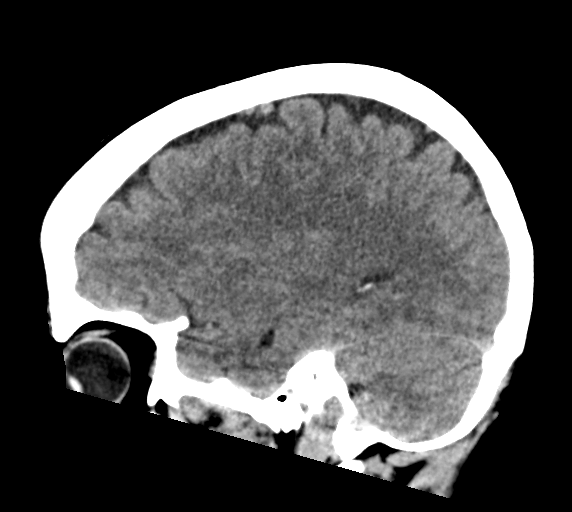

[17 of 47 positions shown; findings below may reference images not displayed]

FINDINGS: Brain: No evidence of acute infarction, hemorrhage, hydrocephalus,
extra-axial collection or mass lesion/mass effect.

Vascular: No hyperdense vessel or unexpected calcification.

Skull: Normal. Negative for fracture or focal lesion.

Sinuses/Orbits: No acute finding.

Other: None.
IMPRESSION: No acute intracranial pathology.

## 2023-08-07 ENCOUNTER — Encounter: Payer: Self-pay | Admitting: Neurology

## 2023-08-12 MED ORDER — QULIPTA 60 MG PO TABS: 60.0000 mg | ORAL_TABLET | Freq: Every day | ORAL | 11 refills | Status: DC

## 2023-08-12 NOTE — Telephone Encounter (Signed)
Meds ordered this encounter  Medications   Atogepant (QULIPTA) 60 MG TABS    Sig: Take 1 tablet (60 mg total) by mouth daily.    Dispense:  30 tablet    Refill:  11

## 2023-08-12 NOTE — Addendum Note (Signed)
Addended by: Glean Salvo on: 08/12/2023 04:26 PM   Modules accepted: Orders

## 2023-08-13 ENCOUNTER — Encounter: Payer: Self-pay | Admitting: Neurology

## 2023-08-19 NOTE — Progress Notes (Signed)
NEUROLOGY CONSULTATION NOTE  Chelsea Frost MRN: [id number] DOB: 11/21/1980  Referring provider: Abran Duke, MD Primary care provider: Abran Duke, MD  Reason for consult:  migraines, numbness  Assessment/Plan:   Migraine without aura, without status migrainosus, not intractable Paresthesias, now particularly affecting the right upper extremity but previously/sometimes left upper extremity - evaluate for radiculopathy vs mononeuropathy   NCV-EMG upper extremities Migraine prevention:  She recently got approval by her insurance to start Qulipta 60mg  daily.  Advised to start.  Discontinue Ajovy.  If no improvement in 6 weeks, she will contact me Migraine rescue:  She will try Ubrelvy samples, which will be effective and not cause drowsiness.  Otherwise, she has naratriptan Limit use of pain relievers to no more than 2 days out of week to prevent risk of rebound or medication-overuse headache. Keep headache diary Follow up 3 months.     Subjective:  Chelsea Frost is a 42 year old right-handed female with anxiety who presents for migraines.  History supplemented by prior neurologist's notes.  Migraines: Onset:  First had migraines in childhood, then resolved.  Restarted when she was in her early 18s. Location:  across forehead, sometimes mid base  Quality:  sharp, dull, sometimes thrbbing Intensity:  7/10 - 10/10.  She denies new headache, thunderclap headache. Aura:  Absent.  Once had a visual aura preceding headache.  Prodrome:  absent Associated symptoms:  Nausea, vomiting, phonophobia, osmophobia.  She denies associated visual disturbance, unilateral numbness or weakness. Duration:  2 hours treated Frequency:  Initially every 2 months.  Controlled on Ajovy since May.  Since September, they have averaged 20 days a month Triggers:  no known triggers Relieving factors:  heating pad on head Activity:  aggravated by migraine.  Cannot  function.  Naratriptan effective but causes drowsiness.  Nurtec effective but not as well as naratriptan.  Migraine treatment protocol:  Nurtec or naratriptan with tizanidine and Zofran  Caffeine:  Coke Zero or diet Dr. Reino Kent daily.  No coffee Diet:  60 oz water daily.  Does not skip meals Exercise:  not routine Depression:  no; Anxiety:  yes Sleep hygiene:  Difficulty falling asleep.  Interrupted.  Sometimes due to pain, children or dog.   Family history of headache:  father, mother   Paresthesias: Off and on for several years.  Last year, she was experiencing numbness, tingling and shooting pain in the left arm.  More recently, she has been experiencing it in the right arm from the elbow down to either digit 4 and 5 or digit 7.  Not positional.  She also has back spasms as well.  Failed multiple muscle relaxants.  Pain may interfere with her sleep.  MRI of cervical spine on 07/15/2022 personally reviewed revealed normal looking spinal cord and mild degenerative disc disease at C5-C6 but no spinal stenosis or neural foraminal stenosis.    MRI and MRA of head on 03/09/2017 personally reviewed were normal.  Past NSAIDS/analgesics:  Fioricet, naproxen, Tylenol, lidocaine NS Past abortive triptans:  sumatriptan (lock jaw), rizatriptan, zolmitriptan Past abortive ergotamine:  none Past muscle relaxants:  baclofen Past anti-emetic:  Reglan Past antihypertensive medications:  metoprolol, verapamil, labetolol Past antidepressant medications:  amitriptyline, venlafaxine, citalopram Past anticonvulsant medications:  topiramate Past anti-CGRP:  none Past vitamins/Herbal/Supplements:  magnesium oxide, riboflavin Past antihistamines/decongestants:  Benadryl Other past therapies:  trigger point injections  Current NSAIDS/analgesics:  none Current triptans:  naratriptan 2.5mg  (makes her foggy) Current ergotamine:  none Current anti-emetic:  Zofran-ODT  4mg  Current muscle relaxants:  tizanidine  4mg  PRN, Skelaxin 800mg  PRN Current Antihypertensive medications:  none Current Antidepressant medications:  fluoxetine 60mg  Current Anticonvulsant medications:  topiramate 100mg  twice daily n0, gabapentin 300mg  three times daily (for left sided paresthesias) Current anti-CGRP:  Ajovy, Nurtec PRN Other therapy:  heating pad Birth control:  Mirena         PAST MEDICAL HISTORY: Past Medical History:  Diagnosis Date   Anxiety    Eczema    GERD (gastroesophageal reflux disease)    Gestational diabetes    glyburide   Gestational diabetes mellitus (GDM), antepartum    Hx of varicella    Hypertension    labetolol 200 mg bid   Migraine     PAST SURGICAL HISTORY: Past Surgical History:  Procedure Laterality Date   BREAST REDUCTION SURGERY     KNEE SURGERY Left    WISDOM TOOTH EXTRACTION      MEDICATIONS: Current Outpatient Medications on File Prior to Visit  Medication Sig Dispense Refill   Atogepant (QULIPTA) 60 MG TABS Take 1 tablet (60 mg total) by mouth daily. 30 tablet 11   busPIRone (BUSPAR) 5 MG tablet Take 5 mg by mouth 2 (two) times daily. 7.5 in morning and 5 mg at night     dicyclomine (BENTYL) 20 MG tablet Take 20 mg by mouth daily as needed for spasms.     FLUoxetine (PROZAC) 20 MG capsule Take 20 mg by mouth daily.     FLUoxetine (PROZAC) 40 MG capsule Take 10 mg by mouth. 60 mg total     Fremanezumab-vfrm (AJOVY) 225 MG/1.5ML SOAJ Inject 225 mg into the skin every 30 (thirty) days. 1.68 mL 11   gabapentin (NEURONTIN) 300 MG capsule Take 1 capsule (300 mg total) by mouth 3 (three) times daily. 90 capsule 3   levocetirizine (XYZAL) 5 MG tablet Take 5 mg by mouth every evening.     levonorgestrel (MIRENA) 20 MCG/24HR IUD 1 each by Intrauterine route once.     metaxalone (SKELAXIN) 800 MG tablet Take 1 tablet (800 mg total) by mouth as needed for muscle spasms. #30/month 30 tablet 3   naratriptan (AMERGE) 2.5 MG tablet Take 1 tablet (2.5 mg total) by mouth as  needed for migraine. Take one (1) tablet at onset of headache; if returns or does not resolve, may repeat after 4 hours; do not exceed five (5) mg in 24 hours. 12 tablet 6   ondansetron (ZOFRAN-ODT) 4 MG disintegrating tablet Take 1 tablet (4 mg total) by mouth every 8 (eight) hours as needed for nausea or vomiting. 20 tablet 3   Rimegepant Sulfate (NURTEC) 75 MG TBDP Take 1 tab at onset of migraine.  May repeat in 2 hrs, if needed.  Max dose: 2 tabs/day. This is a 30 day prescription. 12 tablet 6   tiZANidine (ZANAFLEX) 4 MG tablet Take 1 tablet (4 mg total) by mouth every 6 (six) hours as needed for muscle spasms. 30 tablet 6   topiramate (TOPAMAX) 100 MG tablet Take 1 tablet (100 mg total) by mouth 2 (two) times daily. 180 tablet 3   No current facility-administered medications on file prior to visit.    ALLERGIES: Allergies  Allergen Reactions   Imitrex [Sumatriptan] Other (See Comments)    Jaw issues    Amoxicillin Rash    Has patient had a PCN reaction causing immediate rash, facial/tongue/throat swelling, SOB or lightheadedness with hypotension: No Has patient had a PCN reaction causing severe rash involving mucus  membranes or skin necrosis: No Has patient had a PCN reaction that required hospitalization: No Has patient had a PCN reaction occurring within the last 10 years: Yes If all of the above answers are "NO", then may proceed with Cephalosporin use.     FAMILY HISTORY: Family History  Problem Relation Age of Onset   Lupus Father        lupus anticoagulant   Pulmonary embolism Father    Hiatal hernia Father    Arrhythmia Father    Deep vein thrombosis Father    Migraines Father    GER disease Father    Multiple sclerosis Paternal Aunt    Lupus Paternal Aunt    COPD Maternal Grandmother    Cancer Maternal Grandfather        colon   Hypertension Mother    ADD / ADHD Sister     Objective:  Blood pressure 126/89, pulse 96, height 5\' 3"  (1.6 m), weight 194 lb (88  kg), SpO2 99%. General: No acute distress.  Patient appears well-groomed.   Head:  Normocephalic/atraumatic Eyes:  fundi examined but not visualized Neck: supple, bilateral paraspinal tenderness, full range of motion Back: No paraspinal tenderness Heart: regular rate and rhythm Neurological Exam: Mental status: alert and oriented to person, place, and time, speech fluent and not dysarthric, language intact. Cranial nerves: CN I: not tested CN II: pupils equal, round and reactive to light, visual fields intact CN III, IV, VI:  full range of motion, no nystagmus, no ptosis CN V: endorses mild reduced right V1-V3 sensation CN VII: upper and lower face symmetric CN VIII: hearing intact CN IX, X: gag intact, uvula midline CN XI: sternocleidomastoid and trapezius muscles intact CN XII: tongue midline Bulk & Tone: normal, no fasciculations. Motor:  muscle strength 5/5 throughout Sensation:  Pinprick sensation reduced in 4th, 5th and 2nd digits of right hand and forearm, vibratory sensation intact. Deep Tendon Reflexes:  2+ throughout,  toes downgoing.   Finger to nose testing:  Without dysmetria.    Gait:  Normal station and stride.  Romberg negative.    Thank you for allowing me to take part in the care of this patient.  Shon Millet, DO  CC: Abran Duke, MD

## 2023-08-23 ENCOUNTER — Ambulatory Visit: Payer: BC Managed Care – PPO | Admitting: Neurology

## 2023-08-23 ENCOUNTER — Encounter: Payer: Self-pay | Admitting: Neurology

## 2023-08-23 VITALS — BP 126/89 | HR 96 | Ht 63.0 in | Wt 194.0 lb

## 2023-08-23 DIAGNOSIS — G43709 Chronic migraine without aura, not intractable, without status migrainosus: Secondary | ICD-10-CM | POA: Diagnosis not present

## 2023-08-23 DIAGNOSIS — R202 Paresthesia of skin: Secondary | ICD-10-CM

## 2023-08-23 NOTE — Patient Instructions (Addendum)
  Start Qulipta 60mg  daily.  Contact us in 6 weeks with update and we can increase dose if needed. Take Ubrelvy 100mg  at earliest onset of headache.  May repeat dose once in 2 hours if needed.  Maximum 2 tablets in 24 hours. Limit use of pain relievers to no more than 2 days out of the week.  These medications include acetaminophen, NSAIDs (ibuprofen/Advil/Motrin, naproxen/Aleve, triptans (Imitrex/sumatriptan), Excedrin, and narcotics.  This will help reduce risk of rebound headaches. Be aware of common food triggers Routine exercise Stay adequately hydrated (aim for 64 oz water daily) Keep headache diary Maintain proper stress management Maintain proper sleep hygiene Do not skip meals Consider supplements:  magnesium citrate 400mg  daily, riboflavin 400mg  daily, coenzyme Q10 300mg  daily. Check nerve conduction study of bilateral upper extremities

## 2023-08-27 ENCOUNTER — Encounter: Payer: BC Managed Care – PPO | Admitting: Neurology

## 2023-08-30 ENCOUNTER — Telehealth: Payer: Self-pay

## 2023-08-30 NOTE — Telephone Encounter (Signed)
Mychart message sent.

## 2023-09-01 ENCOUNTER — Telehealth: Payer: BC Managed Care – PPO | Admitting: Neurology

## 2023-10-07 ENCOUNTER — Encounter: Payer: Self-pay | Admitting: Neurology

## 2023-10-07 ENCOUNTER — Encounter: Payer: BC Managed Care – PPO | Admitting: Neurology

## 2023-10-07 DIAGNOSIS — Z029 Encounter for administrative examinations, unspecified: Secondary | ICD-10-CM

## 2023-11-30 NOTE — Progress Notes (Signed)
 NEUROLOGY FOLLOW UP OFFICE NOTE  Chelsea Albino Hellein Taliercio [id number]  Assessment/Plan:   Migraine without aura, without status migrainosus, not intractable Paresthesias, upper extremities but also notes symptoms in feet as well.   NCV-EMG upper extremities but will add right lower extremity as she endorses symptoms off and on in the feet. Also will check B12 and TSH Migraine prevention:  Qulipta  60mg  daily Migraine rescue:  Ubrelvy  (or naratriptan ), tizanidine  and Zofran  as needed.  Discontinue Nurtec. Limit use of pain relievers to no more than 2 days out of week to prevent risk of rebound or medication-overuse headache. Keep headache diary Follow up 6 months.     Subjective:  Chelsea Frost is a 43 year old right-handed female with anxiety who follows up for migraines and paresthesias.  UPDATE: Migraines: Started Qulipta . Nurtec ineffective.  Naratriptan  effective but causes excessive drowsiness.  Tried Ubrelvy  which is effective and doesn't cause drowsiness (unlike naratriptan ) Migraine treatment protocol:  Nurtec or naratriptan  with tizanidine  and Zofran  Naratriptan  effective but sleepy Ubrelvy     Duration:  2 hours with Ubrelvy   Frequency:  2 or 3 days in last 2 months.    Paresthesias of Upper Extremities: NCV-EMG of upper extremities were ordered but missed appointment.  However, she also notes burning and tingling in either foot as well.    Current NSAIDS/analgesics:  none Current triptans:  naratriptan  2.5mg  (makes her foggy) Current ergotamine:  none Current anti-emetic:  Zofran -ODT 4mg  Current muscle relaxants:  tizanidine  4mg  PRN, Skelaxin  800mg  PRN Current Antihypertensive medications:  none Current Antidepressant medications:  fluoxetine 60mg  Current Anticonvulsant medications:  topiramate  100mg  twice daily n0, gabapentin  300mg  three times daily (for left sided paresthesias) Current anti-CGRP:  Qulipta  60mg  daily, Nurtec PRN Other therapy:   heating pad Birth control:  Mirena  Caffeine :  Coke Zero or diet Dr. Nunzio daily.  No coffee Diet:  60 oz water daily.  Does not skip meals Exercise:  not routine Depression:  no; Anxiety:  yes Sleep hygiene:  Difficulty falling asleep.  Interrupted.  Sometimes due to pain, children or dog.     Migraines: Onset:  First had migraines in childhood, then resolved.  Restarted when she was in her early 59s. Location:  across forehead, sometimes mid base  Quality:  sharp, dull, sometimes thrbbing Intensity:  7/10 - 10/10.  She denies new headache, thunderclap headache. Aura:  Absent.  Once had a visual aura preceding headache.  Prodrome:  absent Associated symptoms:  Nausea, vomiting, phonophobia, osmophobia.  She denies associated visual disturbance, unilateral numbness or weakness. Duration:  2 hours treated Frequency:  Initially every 2 months.  Controlled on Ajovy  since May.  Since September, they have averaged 20 days a month Triggers:  no known triggers Relieving factors:  heating pad on head Activity:  aggravated by migraine.  Cannot function.  Naratriptan  effective but causes drowsiness.  Nurtec effective but not as well as naratriptan .  Family history of headache:  father, mother   Paresthesias of Upper Extremities: Off and on for several years.  Last year, she was experiencing numbness, tingling and shooting pain in the left arm.  More recently, she has been experiencing it in the right arm from the elbow down to either digit 4 and 5 or digit 7.  Not positional.  She also has back spasms as well.  Failed multiple muscle relaxants.  Pain may interfere with her sleep.  MRI of cervical spine on 07/15/2022 revealed normal looking spinal cord and mild degenerative disc  disease at C5-C6 but no spinal stenosis or neural foraminal stenosis.  Primarily ulnar Sometimes feet burning  September   MRI and MRA of head on 03/09/2017 were normal.  Past NSAIDS/analgesics:  Fioricet , naproxen ,  Tylenol , lidocaine  NS Past abortive triptans:  sumatriptan (lock jaw), rizatriptan, zolmitriptan Past abortive ergotamine:  none Past muscle relaxants:  baclofen Past anti-emetic:  Reglan  Past antihypertensive medications:  metoprolol , verapamil , labetolol Past antidepressant medications:  amitriptyline, venlafaxine, citalopram  Past anticonvulsant medications:  topiramate  Past anti-CGRP:  Ajovy  Past vitamins/Herbal/Supplements:  magnesium  oxide, riboflavin Past antihistamines/decongestants:  Benadryl  Other past therapies:  trigger point injections    PAST MEDICAL HISTORY: Past Medical History:  Diagnosis Date   Anxiety    Eczema    GERD (gastroesophageal reflux disease)    Gestational diabetes    glyburide   Gestational diabetes mellitus (GDM), antepartum    Hx of varicella    Hypertension    labetolol 200 mg bid   Migraine     MEDICATIONS: Current Outpatient Medications on File Prior to Visit  Medication Sig Dispense Refill   Atogepant  (QULIPTA ) 60 MG TABS Take 1 tablet (60 mg total) by mouth daily. 30 tablet 11   busPIRone  (BUSPAR ) 5 MG tablet Take 5 mg by mouth 2 (two) times daily. 7.5 in morning and 5 mg at night (Patient not taking: Reported on 08/23/2023)     dicyclomine (BENTYL) 20 MG tablet Take 20 mg by mouth daily as needed for spasms. (Patient not taking: Reported on 08/23/2023)     FLUoxetine (PROZAC) 20 MG capsule Take 20 mg by mouth daily.     FLUoxetine (PROZAC) 40 MG capsule Take 20 mg by mouth. 60 mg total     Fremanezumab -vfrm (AJOVY ) 225 MG/1.5ML SOAJ Inject 225 mg into the skin every 30 (thirty) days. 1.68 mL 11   gabapentin  (NEURONTIN ) 300 MG capsule Take 1 capsule (300 mg total) by mouth 3 (three) times daily. 90 capsule 3   levocetirizine (XYZAL) 5 MG tablet Take 5 mg by mouth every evening.     levonorgestrel (MIRENA) 20 MCG/24HR IUD 1 each by Intrauterine route once.     metaxalone  (SKELAXIN ) 800 MG tablet Take 1 tablet (800 mg total) by mouth as  needed for muscle spasms. #30/month 30 tablet 3   naratriptan  (AMERGE) 2.5 MG tablet Take 1 tablet (2.5 mg total) by mouth as needed for migraine. Take one (1) tablet at onset of headache; if returns or does not resolve, may repeat after 4 hours; do not exceed five (5) mg in 24 hours. 12 tablet 6   ondansetron  (ZOFRAN -ODT) 4 MG disintegrating tablet Take 1 tablet (4 mg total) by mouth every 8 (eight) hours as needed for nausea or vomiting. 20 tablet 3   Rimegepant Sulfate (NURTEC) 75 MG TBDP Take 1 tab at onset of migraine.  May repeat in 2 hrs, if needed.  Max dose: 2 tabs/day. This is a 30 day prescription. 12 tablet 6   tiZANidine  (ZANAFLEX ) 4 MG tablet Take 1 tablet (4 mg total) by mouth every 6 (six) hours as needed for muscle spasms. 30 tablet 6   topiramate  (TOPAMAX ) 100 MG tablet Take 1 tablet (100 mg total) by mouth 2 (two) times daily. (Patient not taking: Reported on 08/23/2023) 180 tablet 3   verapamil  (CALAN -SR) 120 MG CR tablet Take 120 mg by mouth daily.     No current facility-administered medications on file prior to visit.    ALLERGIES: Allergies  Allergen Reactions   Imitrex [Sumatriptan] Other (  See Comments)    Jaw issues    Amoxicillin Rash    Has patient had a PCN reaction causing immediate rash, facial/tongue/throat swelling, SOB or lightheadedness with hypotension: No Has patient had a PCN reaction causing severe rash involving mucus membranes or skin necrosis: No Has patient had a PCN reaction that required hospitalization: No Has patient had a PCN reaction occurring within the last 10 years: Yes If all of the above answers are NO, then may proceed with Cephalosporin use.     FAMILY HISTORY: Family History  Problem Relation Age of Onset   Hypertension Mother    Lupus Father        lupus anticoagulant   Pulmonary embolism Father    Hiatal hernia Father    Arrhythmia Father    Deep vein thrombosis Father    Migraines Father    GER disease Father    ADD /  ADHD Sister    Multiple sclerosis Paternal Aunt    Lupus Paternal Aunt    COPD Maternal Grandmother    Cancer Maternal Grandfather        colon   Dementia Paternal Grandmother    Lupus Niece       Objective:  Blood pressure 137/80, pulse 100, height 5' 3 (1.6 m), weight 184 lb (83.5 kg), SpO2 98%. General: No acute distress.  Patient appears well-groomed.   Head:  Normocephalic/atraumatic Eyes:  Fundi examined but not visualized Neck: supple, no paraspinal tenderness, full range of motion Heart:  Regular rate and rhythm Neurological Exam: alert and oriented.  Speech fluent and not dysarthric, language intact.  CN II-XII intact. Bulk and tone normal, muscle strength 5/5 throughout.  Sensation to light touch intact.  Deep tendon reflexes 2+ throughout, toes downgoing.  Finger to nose testing intact.  Gait normal, Romberg negative.   Juliene Dunnings, DO  CC: Erminio Sensing, MD

## 2023-12-01 ENCOUNTER — Ambulatory Visit: Payer: 59 | Admitting: Neurology

## 2023-12-01 ENCOUNTER — Encounter: Payer: Self-pay | Admitting: Neurology

## 2023-12-01 ENCOUNTER — Other Ambulatory Visit: Payer: 59

## 2023-12-01 VITALS — BP 137/80 | HR 100 | Ht 63.0 in | Wt 184.0 lb

## 2023-12-01 DIAGNOSIS — G43009 Migraine without aura, not intractable, without status migrainosus: Secondary | ICD-10-CM | POA: Diagnosis not present

## 2023-12-01 DIAGNOSIS — R202 Paresthesia of skin: Secondary | ICD-10-CM

## 2023-12-01 MED ORDER — QULIPTA 60 MG PO TABS: 60.0000 mg | ORAL_TABLET | Freq: Every day | ORAL | 5 refills | Status: DC

## 2023-12-01 MED ORDER — UBRELVY 100 MG PO TABS: 1.0000 | ORAL_TABLET | ORAL | 5 refills | Status: DC | PRN

## 2023-12-01 NOTE — Patient Instructions (Addendum)
 Nerve study of right leg and bilateral upper extremities Check B12 and TSH. Your provider has requested that you have labwork completed today. Please go to [facility] ([id number]) on the second floor of this building before leaving the office today. You do not need to check in. If you are not called within 15 minutes please check with the front desk.   Continue Qulipta  60mg  daily Ubrelvy  (or naratriptan ) as needed for migraine attacks.  Zofran  for nausea.  Tizanidine  as needed. Keep headache diary Follow up 6 months.

## 2023-12-02 ENCOUNTER — Other Ambulatory Visit (HOSPITAL_COMMUNITY): Payer: Self-pay

## 2023-12-02 ENCOUNTER — Telehealth: Payer: Self-pay | Admitting: Pharmacy Technician

## 2023-12-02 LAB — VITAMIN B12: Vitamin B-12: >2000 pg/mL — ABNORMAL HIGH (ref 200–1100)

## 2023-12-02 LAB — TSH: TSH: 1.36 m[IU]/L

## 2023-12-02 NOTE — Telephone Encounter (Signed)
 Pharmacy Patient Advocate Encounter   Received notification from CoverMyMeds that prior authorization for UBRELVY  100 is required/requested.   Insurance verification completed.   The patient is insured through [facility] .   Per test claim: PA required; PA submitted to above mentioned insurance via CoverMyMeds Key/confirmation #/EOC [id number] Status is pending

## 2023-12-03 ENCOUNTER — Other Ambulatory Visit (HOSPITAL_COMMUNITY): Payer: Self-pay

## 2023-12-03 NOTE — Telephone Encounter (Signed)
 Pharmacy Patient Advocate Encounter  Received notification from [employer] that Prior Authorization for UBRELVY  100MG  has been APPROVED from 2.6.25 to 2.6.26. Ran test claim, Copay is $0. This test claim was processed through [facility]- copay amounts may vary at other pharmacies due to pharmacy/plan contracts, or as the patient moves through the different stages of their insurance plan.   PA #/Case ID/Reference #: [id number]

## 2023-12-17 ENCOUNTER — Other Ambulatory Visit: Payer: Self-pay | Admitting: Neurology

## 2023-12-23 ENCOUNTER — Encounter: Payer: Self-pay | Admitting: Neurology

## 2023-12-24 ENCOUNTER — Other Ambulatory Visit: Payer: Self-pay | Admitting: Neurology

## 2023-12-24 MED ORDER — GABAPENTIN 300 MG PO CAPS: 600.0000 mg | ORAL_CAPSULE | Freq: Three times a day (TID) | ORAL | 5 refills | Status: DC

## 2023-12-30 ENCOUNTER — Ambulatory Visit: Payer: Self-pay | Admitting: Neurology

## 2023-12-30 DIAGNOSIS — R202 Paresthesia of skin: Secondary | ICD-10-CM | POA: Diagnosis not present

## 2023-12-30 NOTE — Procedures (Signed)
 [facility]  [address] [place], [address]  [place], [place] [address] Tel: [phone] Fax: [phone] Test Date:  12/30/2023  Patient: Chelsea Frost DOB: [birth year] Physician: Nita Sickle, DO  Sex: Female Height: 5\' 3"  Ref Phys: Shon Millet, DO  ID#: [id number]   Technician:    History: This is a 43 year old female referred for evaluation of generalized paresthesias of the arms and legs, worse on the left.  NCV & EMG Findings: Extensive electrodiagnostic testing of the left upper and lower extremity shows:  All sensory responses including the left median, ulnar, mixed palmar, sural, and superficial peroneal nerves are within normal limits. All motor responses including the left median, ulnar, peroneal, and tibial nerves are within normal limits. Left tibial H reflex study is within normal limits. There is no evidence of active or chronic motor axonal loss changes affecting any of the tested muscles.  Motor unit configuration and recruitment pattern is within normal limits.  Impression: This is a normal study of the left upper and lower extremities.  In particular, there is no evidence of a cervical/lumbosacral radiculopathy, carpal tunnel syndrome, or sensorimotor polyneuropathy.   ___________________________ Nita Sickle, DO    Nerve Conduction Studies   Stim Site NR Peak (ms) Norm Peak (ms) O-P Amp (V) Norm O-P Amp  Left Median Anti Sensory (2nd Digit)  32 C  Wrist    3.0 <3.4 71.3 >20  Left Sup Peroneal Anti Sensory (Ant Lat Mall)  32 C  12 cm    2.3 <4.5 16.6 >5  Left Sural Anti Sensory (Lat Mall)  32 C  Calf    2.6 <4.5 37.8 >5  Left Ulnar Anti Sensory (5th Digit)  32 C  Wrist    2.6 <3.1 49.7 >12     Stim Site NR Onset (ms) Norm Onset (ms) O-P Amp (mV) Norm O-P Amp Site1 Site2 Delta-0 (ms) Dist (cm) Vel (m/s) Norm Vel (m/s)  Left Median Motor (Abd Poll Brev)  32 C  Wrist    2.8 <3.9 11.2 >6 Elbow Wrist 4.5 28.0 62 >50  Elbow    7.3  11.2          Left Peroneal Motor (Ext Dig Brev)  32 C  Ankle    3.2 <5.5 3.5 >3 B Fib Ankle 7.1 36.0 51 >40  B Fib    10.3  3.3  Poplt B Fib 1.5 8.0 53 >40  Poplt    11.8  3.3         Left Tibial Motor (Abd Hall Brev)  32 C  Ankle    3.1 <6.0 12.1 >8 Knee Ankle 7.7 41.0 53 >40  Knee    10.8  9.4         Left Ulnar Motor (Abd Dig Minimi)  32 C  Wrist    2.3 <3.1 9.8 >7 B Elbow Wrist 3.2 20.0 63 >50  B Elbow    5.5  9.5  A Elbow B Elbow 1.4 10.0 71 >50  A Elbow    6.9  9.4            Stim Site NR Peak (ms) Norm Peak (ms) P-T Amp (V) Site1 Site2 Delta-P (ms) Norm Delta (ms)  Left Median/Ulnar Palm Comparison (Wrist - 8cm)  32 C  Median Palm    1.5 <2.2 115.5 Median Palm Ulnar Palm 0.0   Ulnar Palm    1.5 <2.2 18.7       Electromyography   Side Muscle Ins.Act Fibs  Fasc Recrt Amp Dur Poly Activation Comment  Left 1stDorInt Nml Nml Nml Nml Nml Nml Nml Nml N/A  Left PronatorTeres Nml Nml Nml Nml Nml Nml Nml Nml N/A  Left Biceps Nml Nml Nml Nml Nml Nml Nml Nml N/A  Left Triceps Nml Nml Nml Nml Nml Nml Nml Nml N/A  Left Deltoid Nml Nml Nml Nml Nml Nml Nml Nml N/A  Left AntTibialis Nml Nml Nml Nml Nml Nml Nml Nml N/A  Left Gastroc Nml Nml Nml Nml Nml Nml Nml Nml N/A  Left Flex Dig Long Nml Nml Nml Nml Nml Nml Nml Nml N/A  Left RectFemoris Nml Nml Nml Nml Nml Nml Nml Nml N/A  Left GluteusMed Nml Nml Nml Nml Nml Nml Nml Nml N/A      Waveforms:

## 2023-12-31 ENCOUNTER — Telehealth: Payer: Self-pay

## 2023-12-31 DIAGNOSIS — R202 Paresthesia of skin: Secondary | ICD-10-CM

## 2023-12-31 DIAGNOSIS — G43009 Migraine without aura, not intractable, without status migrainosus: Secondary | ICD-10-CM

## 2023-12-31 NOTE — Telephone Encounter (Signed)
 Per patient mychart, I saw on MyChart that the study was WNL. Just wondering what next steps would be. i'm still doing the 600mg  gabapentin (bid- i'm usually asleep by the time of the 3rd dose) and 1-2/day 800mg  Skelaxin. The paresthesia has gotten better, but is still mildly present (decreased sensation, feeling of burning or freezing). I know that my back is spasming a lot, which isn't helping. I've started having weakness in my left leg, where it feel like it's going to give out if I don't stop moving. I just walked across the street from my office to [place], and my left leg feels extremely fatigued.       Per DrJaffe, Left leg weakness wouldn't be due to a neuropathy.  I would like to check an MRI of the brain with and without contrast for numbness and tingling, headaches and left leg weakness.

## 2024-01-20 ENCOUNTER — Ambulatory Visit
Admission: RE | Admit: 2024-01-20 | Discharge: 2024-01-20 | Disposition: A | Discharge status: Home / Self Care | Source: Ambulatory Visit | Attending: Neurology | Admitting: Neurology

## 2024-01-20 DIAGNOSIS — G43009 Migraine without aura, not intractable, without status migrainosus: Secondary | ICD-10-CM

## 2024-01-20 DIAGNOSIS — R202 Paresthesia of skin: Secondary | ICD-10-CM

## 2024-01-20 MED ORDER — GADOPICLENOL 0.5 MMOL/ML IV SOLN
8.0000 mL | Freq: Once | INTRAVENOUS | Status: AC | PRN
Start: 2024-01-20 — End: 2024-01-20
  Administered 2024-01-20: 8 mL via INTRAVENOUS

## 2024-01-21 ENCOUNTER — Encounter: Payer: Self-pay | Admitting: Neurology

## 2024-01-21 ENCOUNTER — Other Ambulatory Visit: Payer: Self-pay | Admitting: Neurology

## 2024-01-21 MED ORDER — TIZANIDINE HCL 4 MG PO TABS: 4.0000 mg | ORAL_TABLET | Freq: Four times a day (QID) | ORAL | 6 refills | Status: DC | PRN

## 2024-01-21 MED ORDER — NARATRIPTAN HCL 2.5 MG PO TABS: 2.5000 mg | ORAL_TABLET | ORAL | 6 refills | Status: DC | PRN

## 2024-01-26 ENCOUNTER — Encounter: Payer: Self-pay | Admitting: Neurology

## 2024-02-02 ENCOUNTER — Encounter: Payer: Self-pay | Admitting: Neurology

## 2024-02-03 ENCOUNTER — Telehealth: Payer: Self-pay

## 2024-02-03 DIAGNOSIS — R202 Paresthesia of skin: Secondary | ICD-10-CM

## 2024-02-03 NOTE — Telephone Encounter (Signed)
 Patient advised of results.

## 2024-02-03 NOTE — Telephone Encounter (Signed)
-----   Message from Cira Servant sent at 01/31/2024 12:48 PM EDT ----- MRI of brain is normal.  Is there any associated low back pain or pain in the left leg?  If so, we can check MRI of lumbar spine without contrast for left leg weakness.

## 2024-02-03 NOTE — Progress Notes (Signed)
LMOVm for patient to call the office back.  

## 2024-02-10 ENCOUNTER — Ambulatory Visit
Admission: RE | Admit: 2024-02-10 | Discharge: 2024-02-10 | Disposition: A | Discharge status: Home / Self Care | Source: Ambulatory Visit | Attending: Neurology | Admitting: Neurology

## 2024-02-10 DIAGNOSIS — R202 Paresthesia of skin: Secondary | ICD-10-CM

## 2024-02-22 NOTE — Progress Notes (Signed)
 Patient advised.

## 2024-02-23 ENCOUNTER — Encounter: Payer: Self-pay | Admitting: Neurology

## 2024-06-01 NOTE — Progress Notes (Signed)
 NEUROLOGY FOLLOW UP OFFICE NOTE  Birdie Fetty Hellein Lubin [id number]  Assessment/Plan:   Migraine without aura, without status migrainosus, not intractable Paresthesias/dysesthesias, polyarthralgia, myalgias - unclear etiology.  No structural abnormality on imaging.  Pattern of paresthesias does not align with a polyneuropathy.  As they are episodic and seem to occur in flares with polyarthralgia and myalgia, consider autoimmune etiology.   Check labs:  ANA with ENA panel, RF, sed rate, CRP, CCP antibody, CK Migraine prevention:  Qulipta  60mg  daily Migraine rescue:  Ubrelvy  (or naratriptan ), tizanidine  and Zofran  as needed.  Still has naratriptan  if needed. Gabapentin  600mg  TID as needed for neuralgia Limit use of pain relievers to no more than 2 days out of week to prevent risk of rebound or medication-overuse headache. Keep headache diary Follow up 8 months.     Subjective:  Chelsea Frost is a 43 year old right-handed female with anxiety who follows up for migraines and paresthesias.  UPDATE: Migraines: stable Intensity:  2-3/10 Duration:  2 hours with naratriptan  or Ubrelvy   Frequency: 1 a month   Paresthesias of bilateral upper and lower extremities: She initially endorsed paresthesias and dysesthesias (burning or ice cold) in the upper extremities which later spread to the lower extremities.  In February, she noted left leg weakness as well, which would not correlate with a neuropathy.  Labs from 12/01/2023 revealed B12 >2000 and TSH 1.36.  In NCV-EMG of left upper and lower extremities on 12/30/2023 was normal.  MRI of brain with and without contrast on 01/20/2024 was normal.  MRI of lumbar spine on 02/10/2024 showed no significant degenerative spondylosis, spinal or foraminal stenosis.    Starts one side just leg or arm and then travel to other side 2 months for 2-3 times a year (used to be just around summer but now more often  For pain, we increased gabapentin  to  600mg  three times daily.    Current NSAIDS/analgesics:  none Current triptans:  naratriptan  2.5mg  (makes her foggy) Current ergotamine:  none Current anti-emetic:  Zofran -ODT 4mg  Current muscle relaxants:  tizanidine  4mg  PRN, Skelaxin  800mg  PRN Current Antihypertensive medications:  verapamil  180mg  CR daily Current Antidepressant medications:  fluoxetine 60mg  Current Anticonvulsant medications:  topiramate  100mg  twice daily n0, gabapentin  600mg  three times daily (for left sided paresthesias) Current anti-CGRP:  Qulipta  60mg  daily Other therapy:  heating pad Birth control:  Mirena  Caffeine :  Coke Zero or diet Dr. Nunzio daily.  No coffee Diet:  60 oz water daily.  Does not skip meals Exercise:  not routine Depression:  no; Anxiety:  yes Sleep hygiene:  Difficulty falling asleep.  Interrupted.  Sometimes due to pain, children or dog.     Migraines: Onset:  First had migraines in childhood, then resolved.  Restarted when she was in her early 13s. Location:  across forehead, sometimes mid base  Quality:  sharp, dull, sometimes thrbbing Intensity:  7/10 - 10/10.  She denies new headache, thunderclap headache. Aura:  Absent.  Once had a visual aura preceding headache.  Prodrome:  absent Associated symptoms:  Nausea, vomiting, phonophobia, osmophobia.  She denies associated visual disturbance, unilateral numbness or weakness. Duration:  2 hours treated Frequency:  Initially every 2 months.  Controlled on Ajovy  since May.  Since September, they have averaged 20 days a month Triggers:  no known triggers Relieving factors:  heating pad on head Activity:  aggravated by migraine.  Cannot function.  Naratriptan  effective but causes drowsiness.  Nurtec effective but not as well as  naratriptan .  Family history of headache:  father, mother   Paresthesias of Upper Extremities: Off and on for several years.  Usually would occur during or around summer but flare ups have been occurring now 2  to 3 times a year.  Lasts 1 to 2 months.  Last year, she was experiencing numbness, tingling and shooting pain in the left arm.  More recently, she has been experiencing it in the right arm from the elbow down to either digit 4 and 5 or digit 7.  Not positional.  She also has back spasms and polyarthralgia as well.  Failed multiple muscle relaxants.  Pain may interfere with her sleep.  MRI of cervical spine on 07/15/2022 revealed normal looking spinal cord and mild degenerative disc disease at C5-C6 but no spinal stenosis or neural foraminal stenosis.  Sometimes feet burning  MRI and MRA of head on 03/09/2017 were normal.  Past NSAIDS/analgesics:  Fioricet , naproxen , Tylenol , lidocaine  NS Past abortive triptans:  sumatriptan (lock jaw), rizatriptan, zolmitriptan Past abortive ergotamine:  none Past muscle relaxants:  baclofen Past anti-emetic:  Reglan  Past antihypertensive medications:  metoprolol , verapamil , labetolol Past antidepressant medications:  amitriptyline, venlafaxine, citalopram  Past anticonvulsant medications:  topiramate  Past anti-CGRP:  Ajovy , Nurtec PRN, Ubrelvy  (effective but not covered) Past vitamins/Herbal/Supplements:  magnesium  oxide, riboflavin Past antihistamines/decongestants:  Benadryl  Other past therapies:  trigger point injections    PAST MEDICAL HISTORY: Past Medical History:  Diagnosis Date   Anxiety    Eczema    GERD (gastroesophageal reflux disease)    Gestational diabetes    glyburide   Gestational diabetes mellitus (GDM), antepartum    Hx of varicella    Hypertension    labetolol 200 mg bid   Migraine     MEDICATIONS: Current Outpatient Medications on File Prior to Visit  Medication Sig Dispense Refill   Atogepant  (QULIPTA ) 60 MG TABS Take 1 tablet (60 mg total) by mouth daily. 30 tablet 5   dicyclomine (BENTYL) 20 MG tablet Take 20 mg by mouth daily as needed for spasms.     FLUoxetine (PROZAC) 20 MG capsule Take 20 mg by mouth daily.      gabapentin  (NEURONTIN ) 300 MG capsule Take 2 capsules (600 mg total) by mouth 3 (three) times daily. 180 capsule 5   levocetirizine (XYZAL) 5 MG tablet Take 5 mg by mouth every evening.     metaxalone  (SKELAXIN ) 800 MG tablet Take 1 tablet (800 mg total) by mouth as needed for muscle spasms. #30/month 30 tablet 3   naratriptan  (AMERGE) 2.5 MG tablet Take 1 tablet (2.5 mg total) by mouth as needed for migraine. Take one (1) tablet at onset of headache; if returns or does not resolve, may repeat after 4 hours; do not exceed five (5) mg in 24 hours. 12 tablet 6   ondansetron  (ZOFRAN -ODT) 4 MG disintegrating tablet Take 1 tablet (4 mg total) by mouth every 8 (eight) hours as needed for nausea or vomiting. 20 tablet 3   Semaglutide,0.25 or 0.5MG /DOS, 2 MG/3ML SOPN Inject into the skin.     tiZANidine  (ZANAFLEX ) 4 MG tablet Take 1 tablet (4 mg total) by mouth every 6 (six) hours as needed for muscle spasms. 30 tablet 6   Ubrogepant  (UBRELVY ) 100 MG TABS Take 1 tablet (100 mg total) by mouth as needed. May repeat after 2 hours.  Maximum 2 tablets in 24 hours. 10 tablet 5   verapamil  (CALAN -SR) 120 MG CR tablet Take 120 mg by mouth daily.  No current facility-administered medications on file prior to visit.    ALLERGIES: Allergies  Allergen Reactions   Imitrex [Sumatriptan] Other (See Comments)    Jaw issues    Amoxicillin Rash    Has patient had a PCN reaction causing immediate rash, facial/tongue/throat swelling, SOB or lightheadedness with hypotension: No Has patient had a PCN reaction causing severe rash involving mucus membranes or skin necrosis: No Has patient had a PCN reaction that required hospitalization: No Has patient had a PCN reaction occurring within the last 10 years: Yes If all of the above answers are NO, then may proceed with Cephalosporin use.     FAMILY HISTORY: Family History  Problem Relation Age of Onset   Hypertension Mother    Lupus Father        lupus  anticoagulant   Pulmonary embolism Father    Hiatal hernia Father    Arrhythmia Father    Deep vein thrombosis Father    Migraines Father    GER disease Father    ADD / ADHD Sister    Multiple sclerosis Paternal Aunt    Lupus Paternal Aunt    COPD Maternal Grandmother    Cancer Maternal Grandfather        colon   Dementia Paternal Grandmother    Lupus Niece       Objective:  Blood pressure (!) 130/90, pulse (!) 106, height 5' 3 (1.6 m), weight 170 lb (77.1 kg), SpO2 100%. General: No acute distress.  Patient appears well-groomed.   Head:  Normocephalic/atraumatic Neck:  Supple.  No paraspinal tenderness.  Full range of motion. Heart:  Regular rate and rhythm. Neuro:  Alert and oriented.  Speech fluent and not dysarthric.  Language intact.  Decreased left V2-V3.  Otherwise, CN II-XII intact.  Bulk and tone normal.  Muscle strength 5/5 throughout.  Sensation to pinprick and vibration slightly reduced in right upper and lower extremities.  Deep tendon reflexes 2+ throughout, toes downgoing.  Gait normal.  Romberg negative.    Juliene Dunnings, DO  CC: Erminio Sensing, MD

## 2024-06-05 ENCOUNTER — Ambulatory Visit: Payer: Self-pay | Admitting: Neurology

## 2024-06-05 ENCOUNTER — Encounter: Payer: Self-pay | Admitting: Neurology

## 2024-06-05 VITALS — BP 130/90 | HR 106 | Ht 63.0 in | Wt 170.0 lb

## 2024-06-05 DIAGNOSIS — G43009 Migraine without aura, not intractable, without status migrainosus: Secondary | ICD-10-CM

## 2024-06-05 DIAGNOSIS — R202 Paresthesia of skin: Secondary | ICD-10-CM

## 2024-06-05 DIAGNOSIS — M791 Myalgia, unspecified site: Secondary | ICD-10-CM

## 2024-06-05 DIAGNOSIS — M2550 Pain in unspecified joint: Secondary | ICD-10-CM | POA: Diagnosis not present

## 2024-06-05 MED ORDER — TIZANIDINE HCL 4 MG PO TABS: 4.0000 mg | ORAL_TABLET | Freq: Four times a day (QID) | ORAL | 6 refills | Status: DC | PRN

## 2024-06-05 MED ORDER — GABAPENTIN 300 MG PO CAPS: 600.0000 mg | ORAL_CAPSULE | Freq: Three times a day (TID) | ORAL | 5 refills | Status: DC | PRN

## 2024-06-05 MED ORDER — UBRELVY 100 MG PO TABS: 1.0000 | ORAL_TABLET | ORAL | 5 refills | Status: DC | PRN

## 2024-06-05 MED ORDER — QULIPTA 60 MG PO TABS: 60.0000 mg | ORAL_TABLET | Freq: Every day | ORAL | 5 refills | Status: DC

## 2024-06-05 MED ORDER — NARATRIPTAN HCL 2.5 MG PO TABS: 2.5000 mg | ORAL_TABLET | ORAL | 6 refills | Status: DC | PRN

## 2024-06-05 MED ORDER — ONDANSETRON 4 MG PO TBDP
4.0000 mg | ORAL_TABLET | Freq: Three times a day (TID) | ORAL | 3 refills | Status: AC | PRN
Start: 2024-06-05 — End: ?

## 2024-06-05 NOTE — Patient Instructions (Addendum)
 Check labs:  ANA with ENA, RF, sed rate, CRP, CCP antibody, CK. Your provider has requested that you have labwork completed today. Please go to [facility] ([id number]) on the second floor of this building before leaving the office today. You do not need to check in. If you are not called within 15 minutes please check with the front desk.   Migraine prevention:  Qulipta  60mg  daily Migraine rescue:  Ubrelvy  or naratriptan , tizanidine .  Zofran  for nausea Gabapentin  for neuropathic pain Limit use of pain relievers to no more than 9 days out of the month to prevent risk of rebound or medication-overuse headache. Keep headache diary Follow up 8 months.

## 2024-06-09 ENCOUNTER — Telehealth: Payer: Self-pay

## 2024-06-09 LAB — ANA, IFA COMPREHENSIVE PANEL
Anti Nuclear Antibody (ANA): NEGATIVE
ENA SM Ab Ser-aCnc: <1 AI
SM/RNP: <1 AI
SSA (Ro) (ENA) Antibody, IgG: <1 AI
SSB (La) (ENA) Antibody, IgG: <1 AI
Scleroderma (Scl-70) (ENA) Antibody, IgG: <1 AI
ds DNA Ab: <1 [IU]/mL

## 2024-06-09 LAB — CK: Total CK: 62 U/L (ref 20–239)

## 2024-06-09 LAB — C-REACTIVE PROTEIN: CRP: 4.3 mg/L (ref <8.0)

## 2024-06-09 LAB — SEDIMENTATION RATE: Sed Rate: 9 mm/h (ref 0–20)

## 2024-06-09 NOTE — Telephone Encounter (Signed)
 LMOVM for patient,  two labs are needed. Form at the front desk.

## 2024-06-16 ENCOUNTER — Ambulatory Visit: Payer: Self-pay | Admitting: Neurology

## 2024-06-28 ENCOUNTER — Emergency Department (HOSPITAL_COMMUNITY)

## 2024-06-28 ENCOUNTER — Encounter (HOSPITAL_COMMUNITY): Payer: Self-pay | Admitting: Emergency Medicine

## 2024-06-28 ENCOUNTER — Emergency Department (HOSPITAL_COMMUNITY)
Admission: EM | Admit: 2024-06-28 | Discharge: 2024-06-28 | Disposition: A | Discharge status: Home / Self Care | Attending: Emergency Medicine | Admitting: Emergency Medicine

## 2024-06-28 DIAGNOSIS — R002 Palpitations: Secondary | ICD-10-CM

## 2024-06-28 DIAGNOSIS — R072 Precordial pain: Secondary | ICD-10-CM | POA: Diagnosis present

## 2024-06-28 DIAGNOSIS — I159 Secondary hypertension, unspecified: Secondary | ICD-10-CM | POA: Diagnosis not present

## 2024-06-28 DIAGNOSIS — R079 Chest pain, unspecified: Secondary | ICD-10-CM

## 2024-06-28 LAB — I-STAT CHEM 8, ED
BUN: 6 mg/dL (ref 6–20)
Calcium, Ion: 1.09 mmol/L — ABNORMAL LOW (ref 1.15–1.40)
Chloride: 102 mmol/L (ref 98–111)
Creatinine, Ser: 0.8 mg/dL (ref 0.44–1.00)
Glucose, Bld: 80 mg/dL (ref 70–99)
HCT: 44 % (ref 36.0–46.0)
Hemoglobin: 15 g/dL (ref 12.0–15.0)
Potassium: 4 mmol/L (ref 3.5–5.1)
Sodium: 137 mmol/L (ref 135–145)
TCO2: 25 mmol/L (ref 22–32)

## 2024-06-28 LAB — TROPONIN I (HIGH SENSITIVITY)
Troponin I (High Sensitivity): 4 ng/L (ref <18)
Troponin I (High Sensitivity): 4 ng/L (ref <18)

## 2024-06-28 LAB — HCG, SERUM, QUALITATIVE: Preg, Serum: NEGATIVE

## 2024-06-28 LAB — CBC
HCT: 44.2 % (ref 36.0–46.0)
Hemoglobin: 14.5 g/dL (ref 12.0–15.0)
MCH: 30.1 pg (ref 26.0–34.0)
MCHC: 32.8 g/dL (ref 30.0–36.0)
MCV: 91.9 fL (ref 80.0–100.0)
Platelets: 326 K/uL (ref 150–400)
RBC: 4.81 MIL/uL (ref 3.87–5.11)
RDW: 12.8 % (ref 11.5–15.5)
WBC: 6.8 K/uL (ref 4.0–10.5)
nRBC: 0 % (ref 0.0–0.2)

## 2024-06-28 LAB — BASIC METABOLIC PANEL WITH GFR
Anion gap: 11 (ref 5–15)
BUN: 6 mg/dL (ref 6–20)
CO2: 23 mmol/L (ref 22–32)
Calcium: 9.3 mg/dL (ref 8.9–10.3)
Chloride: 101 mmol/L (ref 98–111)
Creatinine, Ser: 0.78 mg/dL (ref 0.44–1.00)
GFR, Estimated: >60 mL/min (ref >60)
Glucose, Bld: 81 mg/dL (ref 70–99)
Potassium: 3.8 mmol/L (ref 3.5–5.1)
Sodium: 135 mmol/L (ref 135–145)

## 2024-06-28 LAB — D-DIMER, QUANTITATIVE: D-Dimer, Quant: <0.27 ug{FEU}/mL (ref 0.00–0.50)

## 2024-06-28 NOTE — ED Notes (Signed)
 Patient transported to X-ray

## 2024-06-28 NOTE — ED Provider Notes (Signed)
 [place] EMERGENCY DEPARTMENT AT [facility] Provider Note   CSN: [id number] Arrival date & time: 06/28/24  1231     Patient presents with: Chest Pain   Chelsea Frost is a 43 y.o. female.   Patient is a 43 year old female with past medical history of hypertension, GERD, migraines, anxiety, and eczema presenting for complaints of chest pain.  Patient states last night she awoke from sleep twice with feelings of palpitations.  This morning she went to work and began having sternal chest pain, pain severity 2 out of 4, nonradiating, without any alleviating or exacerbating factors.  She denies any fevers, chills, coughing.  Admits to intermittent shortness of breath.  Has seen cardiology in the past for similar symptoms with a stable workup including stress test.  No previous loop recorders.  No previous diagnoses of cardiac arrhythmias.  No history of NSTEMI's, STEMI's, or caths.  No history of DVT, PE, unilateral leg swelling, calf pain, or blood clotting disorders.  The history is provided by the patient. No language interpreter was used.  Chest Pain Associated symptoms: no abdominal pain, no back pain, no cough, no fever, no palpitations, no shortness of breath and no vomiting        Prior to Admission medications   Medication Sig Start Date End Date Taking? Authorizing Provider  Atogepant  (QULIPTA ) 60 MG TABS Take 1 tablet (60 mg total) by mouth daily. 06/05/24   Skeet Juliene SAUNDERS, DO  dicyclomine (BENTYL) 20 MG tablet Take 20 mg by mouth daily as needed for spasms.    [provider]  FLUoxetine (PROZAC) 20 MG capsule Take 60 mg by mouth daily. 05/16/24   [provider]  gabapentin  (NEURONTIN ) 300 MG capsule Take 2 capsules (600 mg total) by mouth 3 (three) times daily as needed. 06/05/24   Skeet Juliene SAUNDERS, DO  levocetirizine (XYZAL) 5 MG tablet Take 5 mg by mouth every evening.    [provider]  metaxalone  (SKELAXIN ) 800 MG tablet Take  1 tablet (800 mg total) by mouth as needed for muscle spasms. #30/month 02/18/23   Gayland Lauraine PARAS, NP  naratriptan  (AMERGE) 2.5 MG tablet Take 1 tablet (2.5 mg total) by mouth as needed for migraine. Take one (1) tablet at onset of headache; if returns or does not resolve, may repeat after 4 hours; do not exceed five (5) mg in 24 hours. 06/05/24   Skeet Juliene SAUNDERS, DO  ondansetron  (ZOFRAN -ODT) 4 MG disintegrating tablet Take 1 tablet (4 mg total) by mouth every 8 (eight) hours as needed for nausea or vomiting. 06/05/24   Skeet Juliene SAUNDERS, DO  Semaglutide,0.25 or 0.5MG /DOS, 2 MG/3ML SOPN Inject into the skin.    [provider]  tiZANidine  (ZANAFLEX ) 4 MG tablet Take 1 tablet (4 mg total) by mouth every 6 (six) hours as needed for muscle spasms. 06/05/24   Skeet, Adam R, DO  Ubrogepant  (UBRELVY ) 100 MG TABS Take 1 tablet (100 mg total) by mouth as needed. May repeat after 2 hours.  Maximum 2 tablets in 24 hours. 06/05/24   Skeet Juliene SAUNDERS, DO  verapamil  (CALAN -SR) 180 MG CR tablet Take 180 mg by mouth daily. 05/19/24   [provider]    Allergies: Imitrex [sumatriptan] and Amoxicillin    Review of Systems  Constitutional:  Negative for chills and fever.  HENT:  Negative for ear pain and sore throat.   Eyes:  Negative for pain and visual disturbance.  Respiratory:  Negative for cough  and shortness of breath.   Cardiovascular:  Positive for chest pain. Negative for palpitations.  Gastrointestinal:  Negative for abdominal pain and vomiting.  Genitourinary:  Negative for dysuria and hematuria.  Musculoskeletal:  Negative for arthralgias and back pain.  Skin:  Negative for color change and rash.  Neurological:  Negative for seizures and syncope.  All other systems reviewed and are negative.   Updated Vital Signs BP (!) 164/98 (BP Location: Right Arm)   Pulse 64   Temp 98.3 F (36.8 C) (Oral)   Resp 13   SpO2 100%   Physical Exam Vitals and nursing note reviewed.  Constitutional:       General: She is not in acute distress.    Appearance: She is well-developed.  HENT:     Head: Normocephalic and atraumatic.  Eyes:     Conjunctiva/sclera: Conjunctivae normal.  Cardiovascular:     Rate and Rhythm: Normal rate and regular rhythm.     Heart sounds: No murmur heard. Pulmonary:     Effort: Pulmonary effort is normal. No respiratory distress.     Breath sounds: Normal breath sounds.  Abdominal:     Palpations: Abdomen is soft.     Tenderness: There is no abdominal tenderness.  Musculoskeletal:        General: No swelling.     Cervical back: Neck supple.     Right lower leg: No edema.     Left lower leg: No edema.  Skin:    General: Skin is warm and dry.     Capillary Refill: Capillary refill takes less than 2 seconds.  Neurological:     Mental Status: She is alert.  Psychiatric:        Mood and Affect: Mood normal.     (all labs ordered are listed, but only abnormal results are displayed) Labs Reviewed  I-STAT CHEM 8, ED - Abnormal; Notable for the following components:      Result Value   Calcium, Ion 1.09 (*)    All other components within normal limits  BASIC METABOLIC PANEL WITH GFR  CBC  HCG, SERUM, QUALITATIVE  D-DIMER, QUANTITATIVE  TROPONIN I (HIGH SENSITIVITY)  TROPONIN I (HIGH SENSITIVITY)    EKG: EKG Interpretation Date/Time:  Wednesday June 28 2024 13:29:37 EDT Ventricular Rate:  91 PR Interval:  176 QRS Duration:  82 QT Interval:  374 QTC Calculation: 460 R Axis:   55  Text Interpretation: Normal sinus rhythm Normal ECG Compared with prior EKG from 07/02/2022 Confirmed by Gennaro Bouchard ([id number]) on 06/28/2024 1:40:11 PM  Radiology: [facility] Chest 2 View Result Date: 06/28/2024 CLINICAL DATA:  Chest pain EXAM: CHEST - 2 VIEW COMPARISON:  07/02/2022 FINDINGS: The heart size and mediastinal contours are within normal limits. Both lungs are clear. The visualized skeletal structures are unremarkable. IMPRESSION: No active cardiopulmonary  disease. Electronically Signed   By: Franky Crease M.D.   On: 06/28/2024 14:18     Procedures   Medications Ordered in the ED - No data to display                                  Medical Decision Making Amount and/or Complexity of Data Reviewed Labs: ordered. Radiology: ordered.    43 year old female with past medical history of hypertension, GERD, migraines, anxiety, and eczema presenting for complaints of chest pain.  The patient's chest pain is not suggestive of pulmonary embolus, cardiac ischemia, aortic dissection,  pericarditis, myocarditis, pulmonary embolism, pneumothorax, pneumonia, Zoster, or esophageal perforation, or other serious etiology.  Historically not abrupt in onset, tearing or ripping, pulses symmetric.   EKG nonspecific for ischemia/infarction. No dysrhythmias, brugada, WPW, prolonged QT noted. [CXR reviewed and WNL] Troponin negative x2. CXR reviewed. Labs without demonstration of acute pathology unless otherwise noted above.  D-dimer negative doubt PE pulmonary embolism.  May consider hypertension to be resulting in symptoms.  Blood pressure 164/98.  Recommend rechecking at the house and close follow-up with a primary care physician.  Low HEART Score: 0-3 points (0.9-1.7% risk of MACE). Given the extremely low risk of these diagnoses further testing and evaluation for these possibilities does not appear to be indicated at this time.   Patient in no distress and overall condition improved here in the ED. Detailed discussions were had with the patient regarding current findings, and need for close f/u with PCP or on call doctor. The patient has been instructed to return immediately if the symptoms worsen in any way for re-evaluation. Patient verbalized understanding and is in agreement with current care plan. All questions answered prior to discharge.      Final diagnoses:  Chest pain, unspecified type  Palpitations  Secondary hypertension    ED Discharge  Orders     None          Elnor Bernarda SQUIBB, DO 06/28/24 2054

## 2024-06-28 NOTE — Discharge Instructions (Addendum)
 Today you had a stable cardiac workup including EKG, heart markers called troponins, electrolytes, and a chest x-ray.  Your D-dimer was negative which makes a pulmonary embolism or blood clot to the lungs very unlikely.  You did have a high blood pressure reading of 164/98.  Please decrease salt and alcohol use.  Take your antihypertensive at the house.  Keep a blood pressure log every morning with your blood pressure reading and your symptoms throughout the day.  And follow-up with your primary care physician in the next 2 weeks for repeat blood pressure check.  If you continue to run elevated you will be suggested to increase your current dose of antihypertensives or add on another dose.  Elevated blood pressure can cause chest pressure, palpitations, blurred vision, headaches, and ringing in the ears.  We hope you feel better and please come back to the emergency department for any severe worsening symptoms.

## 2024-06-28 NOTE — ED Triage Notes (Signed)
 Pt reported rapid HR during the night and started feeling nausea. Chest pain intermittent since last night. Tingling in her tongue and lips. SHOB and dizziness since about am.

## 2024-07-02 ENCOUNTER — Other Ambulatory Visit: Payer: Self-pay | Admitting: Neurology

## 2024-07-05 ENCOUNTER — Other Ambulatory Visit (HOSPITAL_COMMUNITY): Payer: Self-pay

## 2024-07-05 NOTE — Telephone Encounter (Signed)
 No PA is needed. Called [facility] and spoke with Damien, and she was able to process #12 tablets/ 30 days for $5 copay.

## 2024-07-06 ENCOUNTER — Other Ambulatory Visit (HOSPITAL_COMMUNITY): Payer: Self-pay

## 2024-07-06 NOTE — Telephone Encounter (Signed)
 Pt has sumatriptan allergy. Filled Naratriptan  2.5mg  on yesterday since that was in the Rx Request.

## 2024-10-05 ENCOUNTER — Other Ambulatory Visit: Payer: Self-pay | Admitting: Neurology

## 2024-10-06 ENCOUNTER — Encounter: Payer: Self-pay | Admitting: Neurology

## 2024-10-06 ENCOUNTER — Other Ambulatory Visit: Payer: Self-pay | Admitting: Neurology

## 2024-10-06 MED ORDER — METAXALONE 800 MG PO TABS: 800.0000 mg | ORAL_TABLET | ORAL | 5 refills | Status: AC | PRN

## 2024-11-03 NOTE — Progress Notes (Unsigned)
 "  NEUROLOGY FOLLOW UP OFFICE NOTE  Chelsea Frost [id number]  Assessment/Plan:   Migraine without aura, without status migrainosus, not intractable Tension-type headache/cervical myofascial pain Paresthesias/dysesthesias, polyarthralgia, myalgias - unclear etiology.  No structural abnormality on imaging.  Pattern of paresthesias does not align with a polyneuropathy.  As they are episodic and seem to occur in flares with polyarthralgia and myalgia, consider autoimmune etiology.   Migraine prevention:  Qulipta  60mg  daily Migraine rescue:  Ubrelvy  (or naratriptan ), tizanidine  and Zofran  as needed.  Still has naratriptan  if needed. For tension-type headache and cervical myofascial pain:   Instead of Skelaxin , she will try methocarbamol  750mg  during the day (hopefully effective with less sedation) Continue tizanidine  at night Start taking gabapentin  300mg  three times daily around the clock (instead of 600mg  three times daily as needed) Gabapentin  300mg  TID as needed for neuralgia Limit use of pain relievers to no more than 9 days out of the month to prevent risk of rebound or medication-overuse headache. Keep headache diary Follow up 6 months.    Subjective:  Chelsea Frost is a 44 year old right-handed female with anxiety who follows up for migraines and paresthesias.  UPDATE: Migraines: On Qulipta . Stable Intensity:  2-3/10 Duration:  2 hours with naratriptan  or Ubrelvy   Frequency: 2-3 days consecutively then headache free for several weeks   Tension-type headaches: 15 to 20 headache days.  Dull unilateral either side radiating into the neck.  Muscle spasms in the neck and trapezius muscle have been a problem.  Cannot take tizanidine  during day as it caused drowsiness.  Skelaxin  does not cause drowsiness but ineffective.  Caffeine :  Coke Zero or diet Dr. Nunzio daily.  No coffee Diet:  60 oz water daily.  Does not skip meals Exercise:  not  routine Depression:  no; Anxiety:  yes Sleep hygiene:  Difficulty falling asleep.  Interrupted.  Sometimes due to pain, children or dog.    Paresthesias, pain, weakness: For pain, she is taking gabapentin  to 600mg  three times daily PRN as well as Skelaxin  800mg  as needed.    Underwent further workup of symptoms.  Labs from 06/05/2024 demonstrated negative ANA, negative ENA panel, sed rate 9, CRP 4.3, and CK 62   Current medications: Current NSAIDS/analgesics:  none Current triptans:  naratriptan  2.5mg  (makes her foggy) Current ergotamine:  none Current anti-emetic:  Zofran -ODT 4mg  Current muscle relaxants:  tizanidine  4mg  PRN, Skelaxin  800mg  PRN Current Antihypertensive medications:  verapamil  180mg  CR daily Current Antidepressant medications:  fluoxetine 60mg  Current Anticonvulsant medications:  topiramate  100mg  twice daily n0, gabapentin  600mg  three times daily (for left sided paresthesias) Current anti-CGRP:  Qulipta  60mg  daily Other therapy:  heating pad Birth control:  Mirena   HISTORY: Migraines: Onset:  First had migraines in childhood, then resolved.  Restarted when she was in her early 44s. Location:  across forehead, sometimes mid base  Quality:  sharp, dull, sometimes thrbbing Intensity:  7/10 - 10/10.  She denies new headache, thunderclap headache. Aura:  Absent.  Once had a visual aura preceding headache.  Prodrome:  absent Associated symptoms:  Nausea, vomiting, phonophobia, osmophobia.  She denies associated visual disturbance, unilateral numbness or weakness. Duration:  2 hours treated Frequency:  Initially every 2 months.  Controlled on Ajovy  since May.  Since September, they have averaged 20 days a month Triggers:  no known triggers Relieving factors:  heating pad on head Activity:  aggravated by migraine.  Cannot function.  Naratriptan  effective but causes drowsiness.  Nurtec effective but  not as well as naratriptan .  Family history of headache:  father,  mother   Paresthesias of Upper Extremities: Off and on for several years.  Usually would occur during or around summer but flare ups have been occurring now 2 to 3 times a year.  Lasts 1 to 2 months.  Last year, she was experiencing numbness, tingling and shooting pain in the left arm.  More recently, she has been experiencing it in the right arm from the elbow down to either digit 4 and 5 or digit 7.  Not positional.  She also has back spasms and polyarthralgia as well.  Failed multiple muscle relaxants.  Pain may interfere with her sleep.  MRI of cervical spine on 07/15/2022 revealed normal looking spinal cord and mild degenerative disc disease at C5-C6 but no spinal stenosis or neural foraminal stenosis.  Sometimes feet burning.  She initially endorsed paresthesias and dysesthesias (burning or ice cold) in the upper extremities which later spread to the lower extremities.  It may start on one side just leg or arm and then travel to other side 2 months for 2-3 times a year.  It used to be just around summer but now more often throughout the year.  In February 2025, she noted left leg weakness as well, which would not correlate with a neuropathy.  Labs from 12/01/2023 revealed B12 >2000 and TSH 1.36.  NCV-EMG of left upper and lower extremities on 12/30/2023 was normal.  MRI of brain with and without contrast on 01/20/2024 was normal.  MRI of lumbar spine on 02/10/2024 showed no significant degenerative spondylosis, spinal or foraminal stenosis.      Prior MRI and MRA of head on 03/09/2017 were normal.  Past medications: Past NSAIDS/analgesics:  Fioricet , naproxen , Tylenol , lidocaine  NS Past abortive triptans:  sumatriptan (lock jaw), rizatriptan, zolmitriptan Past abortive ergotamine:  none Past muscle relaxants:  baclofen, Flexeril Past anti-emetic:  Reglan  Past antihypertensive medications:  metoprolol , verapamil , labetolol Past antidepressant medications:  amitriptyline, venlafaxine, citalopram  Past  anticonvulsant medications:  topiramate  Past anti-CGRP:  Ajovy , Nurtec PRN Past vitamins/Herbal/Supplements:  magnesium  oxide, riboflavin Past antihistamines/decongestants:  Benadryl  Other past therapies:  trigger point injections, physical therapy (trouble with hypermobility)    PAST MEDICAL HISTORY: Past Medical History:  Diagnosis Date   Anxiety    Eczema    GERD (gastroesophageal reflux disease)    Gestational diabetes    glyburide   Gestational diabetes mellitus (GDM), antepartum    Hx of varicella    Hypertension    labetolol 200 mg bid   Migraine     MEDICATIONS: Current Outpatient Medications on File Prior to Visit  Medication Sig Dispense Refill   Atogepant  (QULIPTA ) 60 MG TABS Take 1 tablet (60 mg total) by mouth daily. 30 tablet 5   dicyclomine (BENTYL) 20 MG tablet Take 20 mg by mouth daily as needed for spasms.     FLUoxetine (PROZAC) 20 MG capsule Take 60 mg by mouth daily.     gabapentin  (NEURONTIN ) 300 MG capsule Take 2 capsules (600 mg total) by mouth 3 (three) times daily as needed. 180 capsule 5   levocetirizine (XYZAL) 5 MG tablet Take 5 mg by mouth every evening.     metaxalone  (SKELAXIN ) 800 MG tablet Take 1 tablet (800 mg total) by mouth as needed for muscle spasms. #30/month 30 tablet 5   naratriptan  (AMERGE) 2.5 MG tablet Take 1 tablet (2.5 mg total) by mouth as needed for migraine. Take one (1) tablet at onset of headache;  if returns or does not resolve, may repeat after 4 hours; do not exceed five (5) mg in 24 hours. 12 tablet 6   ondansetron  (ZOFRAN -ODT) 4 MG disintegrating tablet Take 1 tablet (4 mg total) by mouth every 8 (eight) hours as needed for nausea or vomiting. 20 tablet 3   Semaglutide,0.25 or 0.5MG /DOS, 2 MG/3ML SOPN Inject into the skin.     tiZANidine  (ZANAFLEX ) 4 MG tablet Take 1 tablet (4 mg total) by mouth every 6 (six) hours as needed for muscle spasms. 30 tablet 6   Ubrogepant  (UBRELVY ) 100 MG TABS Take 1 tablet (100 mg total) by  mouth as needed. May repeat after 2 hours.  Maximum 2 tablets in 24 hours. 10 tablet 5   verapamil  (CALAN -SR) 180 MG CR tablet Take 180 mg by mouth daily.     No current facility-administered medications on file prior to visit.    ALLERGIES: Allergies  Allergen Reactions   Imitrex [Sumatriptan] Other (See Comments)    Jaw issues    Amoxicillin Rash    Has patient had a PCN reaction causing immediate rash, facial/tongue/throat swelling, SOB or lightheadedness with hypotension: No Has patient had a PCN reaction causing severe rash involving mucus membranes or skin necrosis: No Has patient had a PCN reaction that required hospitalization: No Has patient had a PCN reaction occurring within the last 10 years: Yes If all of the above answers are NO, then may proceed with Cephalosporin use.     FAMILY HISTORY: Family History  Problem Relation Age of Onset   Hypertension Mother    Lupus Father        lupus anticoagulant   Pulmonary embolism Father    Hiatal hernia Father    Arrhythmia Father    Deep vein thrombosis Father    Migraines Father    GER disease Father    ADD / ADHD Sister    Multiple sclerosis Paternal Aunt    Lupus Paternal Aunt    COPD Maternal Grandmother    Cancer Maternal Grandfather        colon   Dementia Paternal Grandmother    Lupus Niece       Objective:  Blood pressure 98/67, pulse 87, height 5' 2 (1.575 m), weight 165 lb 6.4 oz (75 kg). General: No acute distress.  Patient appears well-groomed.   Head:  Normocephalic/atraumatic Neck:  Supple.  right paraspinal tenderness.  Full range of motion. Heart:  Regular rate and rhythm. Neuro:  Alert and oriented.  Speech fluent and not dysarthric.  Language intact.  Decreased left V2-V3.  Otherwise, CN II-XII intact.  Bulk and tone normal.  Muscle strength 5/5 throughout.  Sensation to pinprick and vibration slightly reduced in right upper and lower extremities.  Deep tendon reflexes 2+ throughout, toes  downgoing.  Gait normal.  Romberg negative.     Juliene Dunnings, DO  CC: Erminio Sensing, MD       "

## 2024-11-06 ENCOUNTER — Encounter: Payer: Self-pay | Admitting: Neurology

## 2024-11-06 ENCOUNTER — Ambulatory Visit: Admitting: Neurology

## 2024-11-06 VITALS — BP 98/67 | HR 87 | Ht 62.0 in | Wt 165.4 lb

## 2024-11-06 DIAGNOSIS — M7918 Myalgia, other site: Secondary | ICD-10-CM | POA: Diagnosis not present

## 2024-11-06 DIAGNOSIS — G43009 Migraine without aura, not intractable, without status migrainosus: Secondary | ICD-10-CM

## 2024-11-06 MED ORDER — QULIPTA 60 MG PO TABS: 60.0000 mg | ORAL_TABLET | Freq: Every day | ORAL | 5 refills | Status: AC

## 2024-11-06 MED ORDER — GABAPENTIN 300 MG PO CAPS: 600.0000 mg | ORAL_CAPSULE | Freq: Three times a day (TID) | ORAL | 5 refills | Status: AC | PRN

## 2024-11-06 MED ORDER — TIZANIDINE HCL 4 MG PO TABS: 4.0000 mg | ORAL_TABLET | Freq: Four times a day (QID) | ORAL | 6 refills | Status: AC | PRN

## 2024-11-06 MED ORDER — METHOCARBAMOL 750 MG PO TABS: 750.0000 mg | ORAL_TABLET | Freq: Four times a day (QID) | ORAL | 5 refills | Status: AC | PRN

## 2024-11-06 MED ORDER — NARATRIPTAN HCL 2.5 MG PO TABS: 2.5000 mg | ORAL_TABLET | ORAL | 6 refills | Status: AC | PRN

## 2024-11-06 MED ORDER — UBRELVY 100 MG PO TABS: 1.0000 | ORAL_TABLET | ORAL | 5 refills | Status: AC | PRN

## 2024-11-09 ENCOUNTER — Other Ambulatory Visit: Payer: Self-pay | Admitting: Neurology

## 2025-05-14 ENCOUNTER — Ambulatory Visit: Payer: Self-pay | Admitting: Neurology
